# Patient Record
Sex: Male | Born: 1965 | Race: Black or African American | Hispanic: No | Smoking: Never smoker
Health system: Southern US, Community
[De-identification: ages and names within clinical notes are randomized; demographics above are authoritative.]

## PROBLEM LIST (undated history)

## (undated) DIAGNOSIS — M75100 Unspecified rotator cuff tear or rupture of unspecified shoulder, not specified as traumatic: Secondary | ICD-10-CM

## (undated) DIAGNOSIS — R519 Headache, unspecified: Secondary | ICD-10-CM

## (undated) DIAGNOSIS — K219 Gastro-esophageal reflux disease without esophagitis: Secondary | ICD-10-CM

## (undated) DIAGNOSIS — I10 Essential (primary) hypertension: Secondary | ICD-10-CM

## (undated) DIAGNOSIS — M1990 Unspecified osteoarthritis, unspecified site: Secondary | ICD-10-CM

## (undated) DIAGNOSIS — E119 Type 2 diabetes mellitus without complications: Secondary | ICD-10-CM

## (undated) DIAGNOSIS — F4310 Post-traumatic stress disorder, unspecified: Secondary | ICD-10-CM

## (undated) DIAGNOSIS — Z8489 Family history of other specified conditions: Secondary | ICD-10-CM

## (undated) DIAGNOSIS — R51 Headache: Secondary | ICD-10-CM

## (undated) DIAGNOSIS — G4730 Sleep apnea, unspecified: Secondary | ICD-10-CM

## (undated) DIAGNOSIS — N189 Chronic kidney disease, unspecified: Secondary | ICD-10-CM

## (undated) DIAGNOSIS — G43909 Migraine, unspecified, not intractable, without status migrainosus: Secondary | ICD-10-CM

## (undated) HISTORY — PX: KNEE ARTHROSCOPY: SUR90

## (undated) HISTORY — PX: SHOULDER ARTHROSCOPY: SHX128

## (undated) HISTORY — PX: WISDOM TOOTH EXTRACTION: SHX21

---

## 2009-07-13 ENCOUNTER — Encounter: Admission: RE | Admit: 2009-07-13 | Discharge: 2009-07-13 | Payer: Self-pay | Admitting: Orthopaedic Surgery

## 2010-07-17 IMAGING — RF DG FLUORO GUIDE NDL PLC/BX
2 series · 2 of 2 positions shown · IV contrast (magnevist)
Comparison: none

CLINICAL DATA: Right shoulder pain.  Prior history of rotator cuff
repair.

RIGHT SHOULDER INJECTION UNDER FLUOROSCOPY
TECHNIQUE: An appropriate skin entrance site was determined. The
site was marked, prepped with Betadine, draped in the usual sterile
fashion, and infiltrated locally with buffered Lidocaine.  22 gauge
spinal needle was advanced to the superomedial margin of the
humeral head under intermittent fluoroscopy.  1 ml of Lidocaine
injected easily.  A 0.2 ml Magnevist, 10 ml 1% lidocaine, 5 ml
Mamdouh, 5 ml sterile normal saline was then used to opacify the
right shoulder capsule.
Contrast is seen extravasating into the subacromial and subdeltoid
bursa consistent with full-thickness tearing of the rotator cuff.
No immediate complication.
Fluoroscopy Time: 39 seconds.

[Series 1: (hospital) · 1 of 1 slices shown (1 of 2)]
[im 1/1]
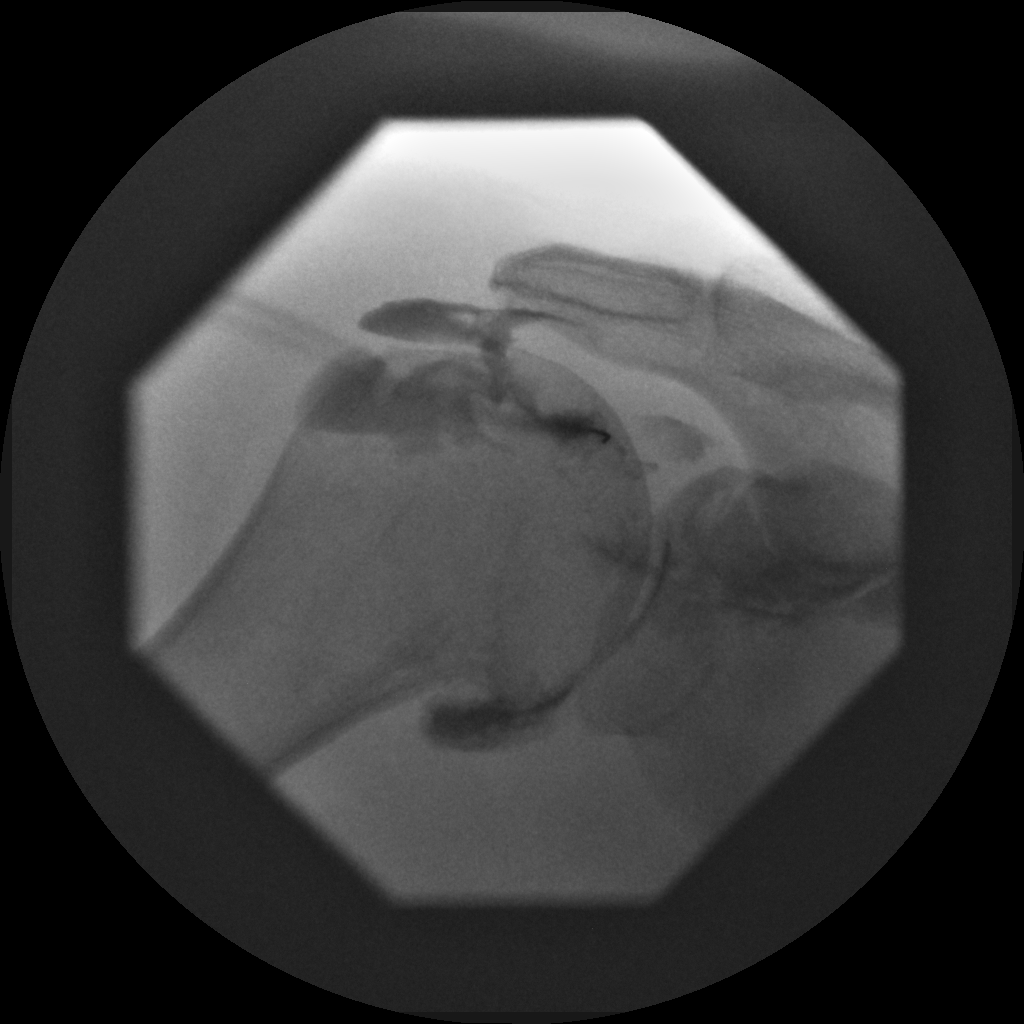

[Series 2: (hospital) · 1 of 1 slices shown (2 of 2)]
[im 1/1]
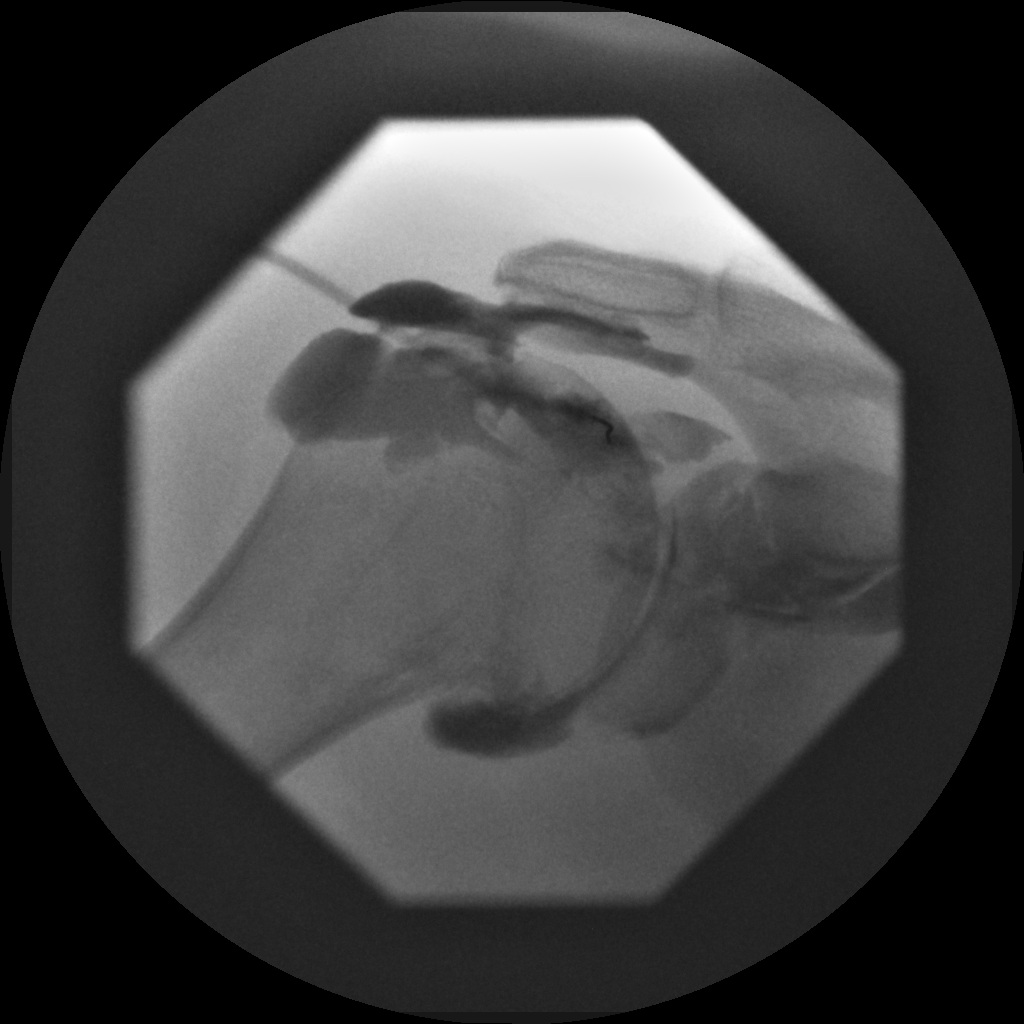

[2 of 2 positions shown; findings below may reference images not displayed]

IMPRESSION: Technically successful right shoulder injection for MRI. Full-
thickness tearing of the rotator cuff is evident.

## 2010-10-21 ENCOUNTER — Encounter: Payer: Self-pay | Admitting: Orthopaedic Surgery

## 2013-09-28 ENCOUNTER — Other Ambulatory Visit: Payer: Self-pay | Admitting: Orthopedic Surgery

## 2013-10-01 ENCOUNTER — Encounter (HOSPITAL_BASED_OUTPATIENT_CLINIC_OR_DEPARTMENT_OTHER): Payer: Self-pay | Admitting: *Deleted

## 2013-10-05 NOTE — H&P (Signed)
Nathan Carson is an 48 y.o. male.   Chief Complaint: Chronic insertional right Achilles tendinitis   HPI: Patient is seen in consultation from Dr. Althea Charon for chronic right Achilles tendinitis with a recent MRI scan that shows a partial thickness tear off the calcaneus.  He was injured at work as a [place] [employer] worker in May of 2014, has been treated extensively with a boot for 2 months, physical therapy with stretching and still has persistent pain that limits his activities and makes him limp.  Presently he is standing and walking 1/8 hours a day and has a 10 pound maximum lift and even with this has persistent pain that limits his activities on the weekends.  He brings the MRI scan of his ankle with him and it does show edema at the calcaneal tubercle with a partial thickness tear of the Achilles tendon.  This is also where he is exquisitely tender.  Past Medical History  Diagnosis Date  . Sleep apnea     wears CPAP sometimes  . Hyperlipidemia     takes simvastatin    Past Surgical History  Procedure Laterality Date  . Shoulder arthroscopy Right   . Knee arthroscopy Left     History reviewed. No pertinent family history. Social History:  reports that he has never smoked. He has never used smokeless tobacco. He reports that he drinks alcohol. He reports that he does not use illicit drugs.  Allergies:  Allergies  Allergen Reactions  . Sulfa Antibiotics Hives    No prescriptions prior to admission    No results found for this or any previous visit (from the past 48 hour(s)). No results found.  Review of Systems  Constitutional: Negative.   HENT: Negative.   Eyes: Negative.   Respiratory: Negative.   Cardiovascular: Negative.   Gastrointestinal: Negative.   Genitourinary: Negative.   Musculoskeletal: Positive for joint pain.  Skin: Negative.   Neurological: Negative.   Endo/Heme/Allergies: Negative.   Psychiatric/Behavioral: Negative.     Height 6' (1.829 m), weight  127.461 kg (281 lb). Physical Exam  Constitutional: He is oriented to person, place, and time. He appears well-developed and well-nourished.  HENT:  Head: Normocephalic and atraumatic.  Eyes: Pupils are equal, round, and reactive to light.  Neck: Normal range of motion. Neck supple.  Cardiovascular: Intact distal pulses.   Respiratory: Effort normal.  Musculoskeletal:  Regarding his flexibility with his knee in full extension he can dorsiflex his right foot 30 he is tender at the insertion of the Achilles tendon and the swelling is about 50% thicker than the contralateral side.  When he tries to do a standing toe raise on that side it causes pain and he cannot do it.  He is neurovascularly intact.  There are no cuts scrapes or abrasions to the skin.  Neurological: He is alert and oriented to person, place, and time.  Skin: Skin is warm and dry.  Psychiatric: He has a normal mood and affect. His behavior is normal. Judgment and thought content normal.     Assessment/Plan  Assess: Chronic insertional right Achilles tendinitis with partial-thickness tear, now symptomatic approaching 7 months.  Plan: Options were discussed with the patient.  He is a candidate for removal of the scar tissue and then reinsertion of the Achilles tendon on the calcaneal tubercle.  This will require 3-6 months of rehabilitation before he be able to resume his normal job as a [place] [employer] worker.  He is failed to heal with conservative measures  and this is a reasonable option at this point.  I filled out a new CA 17, and continued his work restrictions with a 10 pound continuous lift 15 pound intermittent left walking 1 hour a day with a maximum standing of 8 hours a day.  PHILLIPS, ERIC R 10/05/2013, 11:12 AM

## 2013-10-06 ENCOUNTER — Ambulatory Visit (HOSPITAL_BASED_OUTPATIENT_CLINIC_OR_DEPARTMENT_OTHER): Admitting: Anesthesiology

## 2013-10-06 ENCOUNTER — Encounter (HOSPITAL_BASED_OUTPATIENT_CLINIC_OR_DEPARTMENT_OTHER)
Admission: RE | Disposition: A | Discharge status: Home / Self Care | Payer: Self-pay | Source: Ambulatory Visit | Attending: Orthopedic Surgery

## 2013-10-06 ENCOUNTER — Ambulatory Visit (HOSPITAL_BASED_OUTPATIENT_CLINIC_OR_DEPARTMENT_OTHER)
Admission: RE | Admit: 2013-10-06 | Discharge: 2013-10-06 | Disposition: A | Discharge status: Home / Self Care | Source: Ambulatory Visit | Attending: Orthopedic Surgery | Admitting: Orthopedic Surgery

## 2013-10-06 ENCOUNTER — Encounter (HOSPITAL_BASED_OUTPATIENT_CLINIC_OR_DEPARTMENT_OTHER): Payer: Self-pay | Admitting: *Deleted

## 2013-10-06 ENCOUNTER — Encounter (HOSPITAL_BASED_OUTPATIENT_CLINIC_OR_DEPARTMENT_OTHER): Admitting: Anesthesiology

## 2013-10-06 DIAGNOSIS — G4730 Sleep apnea, unspecified: Secondary | ICD-10-CM | POA: Diagnosis not present

## 2013-10-06 DIAGNOSIS — M7660 Achilles tendinitis, unspecified leg: Secondary | ICD-10-CM | POA: Insufficient documentation

## 2013-10-06 DIAGNOSIS — Y990 Civilian activity done for income or pay: Secondary | ICD-10-CM | POA: Diagnosis not present

## 2013-10-06 DIAGNOSIS — X58XXXA Exposure to other specified factors, initial encounter: Secondary | ICD-10-CM | POA: Diagnosis not present

## 2013-10-06 DIAGNOSIS — Z6838 Body mass index (BMI) 38.0-38.9, adult: Secondary | ICD-10-CM | POA: Diagnosis not present

## 2013-10-06 DIAGNOSIS — S96819A Strain of other specified muscles and tendons at ankle and foot level, unspecified foot, initial encounter: Principal | ICD-10-CM

## 2013-10-06 DIAGNOSIS — S93499A Sprain of other ligament of unspecified ankle, initial encounter: Secondary | ICD-10-CM | POA: Diagnosis not present

## 2013-10-06 DIAGNOSIS — S86011A Strain of right Achilles tendon, initial encounter: Secondary | ICD-10-CM

## 2013-10-06 HISTORY — PX: ACHILLES TENDON SURGERY: SHX542

## 2013-10-06 HISTORY — DX: Sleep apnea, unspecified: G47.30

## 2013-10-06 LAB — POCT HEMOGLOBIN-HEMACUE: Hemoglobin: 15 g/dL (ref 13.0–17.0)

## 2013-10-06 SURGERY — REPAIR, TENDON, ACHILLES
Anesthesia: General | Site: Ankle | Laterality: Right

## 2013-10-06 MED ORDER — DEXTROSE 5 % IV SOLN
3.0000 g | Freq: Once | INTRAVENOUS | Status: AC
  Administered 2013-10-06: 3 g via INTRAVENOUS

## 2013-10-06 MED ORDER — OXYCODONE HCL 5 MG/5ML PO SOLN: 5.0000 mg | Freq: Once | ORAL | Status: DC | PRN

## 2013-10-06 MED ORDER — FENTANYL CITRATE 0.05 MG/ML IJ SOLN
INTRAMUSCULAR | Status: AC
  Filled 2013-10-06: qty 2

## 2013-10-06 MED ORDER — CEFAZOLIN SODIUM-DEXTROSE 2-3 GM-% IV SOLR: 2.0000 g | INTRAVENOUS | Status: DC

## 2013-10-06 MED ORDER — DEXAMETHASONE SODIUM PHOSPHATE 10 MG/ML IJ SOLN
INTRAMUSCULAR | Status: DC | PRN
  Administered 2013-10-06: 10 mg

## 2013-10-06 MED ORDER — BUPIVACAINE-EPINEPHRINE PF 0.5-1:200000 % IJ SOLN
INTRAMUSCULAR | Status: DC | PRN
  Administered 2013-10-06: 30 mL via PERINEURAL

## 2013-10-06 MED ORDER — ONDANSETRON HCL 4 MG/2ML IJ SOLN
INTRAMUSCULAR | Status: DC | PRN
  Administered 2013-10-06: 4 mg via INTRAVENOUS

## 2013-10-06 MED ORDER — MIDAZOLAM HCL 2 MG/2ML IJ SOLN
INTRAMUSCULAR | Status: AC
  Filled 2013-10-06: qty 2

## 2013-10-06 MED ORDER — CHLORHEXIDINE GLUCONATE 4 % EX LIQD: 60.0000 mL | Freq: Once | CUTANEOUS | Status: DC

## 2013-10-06 MED ORDER — PROPOFOL 10 MG/ML IV BOLUS
INTRAVENOUS | Status: DC | PRN
  Administered 2013-10-06: 50 mg via INTRAVENOUS
  Administered 2013-10-06: 270 mg via INTRAVENOUS

## 2013-10-06 MED ORDER — LIDOCAINE HCL (CARDIAC) 10 MG/ML IV SOLN
INTRAVENOUS | Status: DC | PRN
  Administered 2013-10-06: 100 mg via INTRAVENOUS

## 2013-10-06 MED ORDER — DEXAMETHASONE SODIUM PHOSPHATE 4 MG/ML IJ SOLN
INTRAMUSCULAR | Status: DC | PRN
  Administered 2013-10-06: 10 mg via INTRAVENOUS

## 2013-10-06 MED ORDER — SUCCINYLCHOLINE CHLORIDE 20 MG/ML IJ SOLN
INTRAMUSCULAR | Status: DC | PRN
  Administered 2013-10-06: 160 mg via INTRAVENOUS

## 2013-10-06 MED ORDER — FENTANYL CITRATE 0.05 MG/ML IJ SOLN
50.0000 ug | INTRAMUSCULAR | Status: DC | PRN
  Administered 2013-10-06: 100 ug via INTRAVENOUS

## 2013-10-06 MED ORDER — HYDROMORPHONE HCL PF 1 MG/ML IJ SOLN
0.2500 mg | INTRAMUSCULAR | Status: DC | PRN
  Administered 2013-10-06: 0.5 mg via INTRAVENOUS
  Filled 2013-10-06: qty 1

## 2013-10-06 MED ORDER — ONDANSETRON HCL 4 MG/2ML IJ SOLN: 4.0000 mg | Freq: Once | INTRAMUSCULAR | Status: DC | PRN

## 2013-10-06 MED ORDER — HYDROCODONE-ACETAMINOPHEN 5-325 MG PO TABS: 1.0000 | ORAL_TABLET | Freq: Four times a day (QID) | ORAL | Status: DC | PRN

## 2013-10-06 MED ORDER — FENTANYL CITRATE 0.05 MG/ML IJ SOLN
INTRAMUSCULAR | Status: AC
  Filled 2013-10-06: qty 4

## 2013-10-06 MED ORDER — OXYCODONE HCL 5 MG PO TABS: 5.0000 mg | ORAL_TABLET | Freq: Once | ORAL | Status: DC | PRN

## 2013-10-06 MED ORDER — MIDAZOLAM HCL 2 MG/2ML IJ SOLN
1.0000 mg | INTRAMUSCULAR | Status: DC | PRN
  Administered 2013-10-06: 2 mg via INTRAVENOUS

## 2013-10-06 MED ORDER — GLYCOPYRROLATE 0.2 MG/ML IJ SOLN
INTRAMUSCULAR | Status: DC | PRN
  Administered 2013-10-06: 0.2 mg via INTRAVENOUS

## 2013-10-06 MED ORDER — EPHEDRINE SULFATE 50 MG/ML IJ SOLN
INTRAMUSCULAR | Status: DC | PRN
  Administered 2013-10-06 (×3): 10 mg via INTRAVENOUS

## 2013-10-06 MED ORDER — FENTANYL CITRATE 0.05 MG/ML IJ SOLN
INTRAMUSCULAR | Status: DC | PRN
  Administered 2013-10-06: 100 ug via INTRAVENOUS

## 2013-10-06 MED ORDER — LACTATED RINGERS IV SOLN
INTRAVENOUS | Status: DC
  Administered 2013-10-06 (×2): via INTRAVENOUS

## 2013-10-06 MED ORDER — PROPOFOL 10 MG/ML IV BOLUS
INTRAVENOUS | Status: AC
  Filled 2013-10-06: qty 20

## 2013-10-06 MED ORDER — DEXTROSE-NACL 5-0.45 % IV SOLN: INTRAVENOUS | Status: DC

## 2013-10-06 SURGICAL SUPPLY — 70 items
BANDAGE ELASTIC 4 VELCRO ST LF (GAUZE/BANDAGES/DRESSINGS) ×3 IMPLANT
BANDAGE ELASTIC 6 VELCRO ST LF (GAUZE/BANDAGES/DRESSINGS) IMPLANT
BANDAGE ESMARK 6X9 LF (GAUZE/BANDAGES/DRESSINGS) ×1 IMPLANT
BLADE SURG 10 STRL SS (BLADE) ×3 IMPLANT
BLADE SURG 15 STRL LF DISP TIS (BLADE) ×1 IMPLANT
BLADE SURG 15 STRL SS (BLADE) ×2
BNDG ESMARK 6X9 LF (GAUZE/BANDAGES/DRESSINGS) ×3
COVER TABLE BACK 60X90 (DRAPES) ×3 IMPLANT
CUFF TOURNIQUET SINGLE 34IN LL (TOURNIQUET CUFF) IMPLANT
DECANTER SPIKE VIAL GLASS SM (MISCELLANEOUS) IMPLANT
DRAPE EXTREMITY T 121X128X90 (DRAPE) ×3 IMPLANT
DRAPE U 20/CS (DRAPES) ×3 IMPLANT
DRAPE U-SHAPE 47X51 STRL (DRAPES) ×3 IMPLANT
DURA STEPPER LG (CAST SUPPLIES) ×3 IMPLANT
DURAPREP 26ML APPLICATOR (WOUND CARE) ×3 IMPLANT
ELECT REM PT RETURN 9FT ADLT (ELECTROSURGICAL) ×3
ELECTRODE REM PT RTRN 9FT ADLT (ELECTROSURGICAL) ×1 IMPLANT
GAUZE SPONGE 4X4 16PLY XRAY LF (GAUZE/BANDAGES/DRESSINGS) IMPLANT
GAUZE XEROFORM 1X8 LF (GAUZE/BANDAGES/DRESSINGS) ×3 IMPLANT
GLOVE BIO SURGEON STRL SZ7.5 (GLOVE) ×3 IMPLANT
GLOVE BIO SURGEON STRL SZ8.5 (GLOVE) ×3 IMPLANT
GLOVE BIOGEL PI IND STRL 7.0 (GLOVE) ×1 IMPLANT
GLOVE BIOGEL PI IND STRL 8 (GLOVE) ×1 IMPLANT
GLOVE BIOGEL PI IND STRL 9 (GLOVE) ×1 IMPLANT
GLOVE BIOGEL PI INDICATOR 7.0 (GLOVE) ×2
GLOVE BIOGEL PI INDICATOR 8 (GLOVE) ×2
GLOVE BIOGEL PI INDICATOR 9 (GLOVE) ×2
GLOVE SURG SS PI 7.0 STRL IVOR (GLOVE) ×6 IMPLANT
GOWN STRL REUS W/ TWL LRG LVL3 (GOWN DISPOSABLE) ×1 IMPLANT
GOWN STRL REUS W/ TWL XL LVL3 (GOWN DISPOSABLE) ×2 IMPLANT
GOWN STRL REUS W/TWL LRG LVL3 (GOWN DISPOSABLE) ×2
GOWN STRL REUS W/TWL XL LVL3 (GOWN DISPOSABLE) ×4
NEEDLE 1/2 CIR CATGUT .05X1.09 (NEEDLE) ×3 IMPLANT
NEEDLE HYPO 25X1 1.5 SAFETY (NEEDLE) IMPLANT
NS IRRIG 1000ML POUR BTL (IV SOLUTION) ×3 IMPLANT
PAD CAST 4YDX4 CTTN HI CHSV (CAST SUPPLIES) ×1 IMPLANT
PADDING CAST ABS 4INX4YD NS (CAST SUPPLIES) ×2
PADDING CAST ABS COTTON 4X4 ST (CAST SUPPLIES) ×1 IMPLANT
PADDING CAST COTTON 4X4 STRL (CAST SUPPLIES) ×2
PADDING CAST SYNTHETIC 4 (CAST SUPPLIES)
PADDING CAST SYNTHETIC 4X4 STR (CAST SUPPLIES) IMPLANT
PASSER SUT SWANSON 36MM LOOP (INSTRUMENTS) IMPLANT
PENCIL BUTTON HOLSTER BLD 10FT (ELECTRODE) ×3 IMPLANT
SCOTCHCAST PLUS 4X4 WHITE (CAST SUPPLIES) IMPLANT
SLEEVE SCD COMPRESS KNEE MED (MISCELLANEOUS) IMPLANT
SPONGE GAUZE 4X4 12PLY (GAUZE/BANDAGES/DRESSINGS) ×3 IMPLANT
SPONGE LAP 4X18 X RAY DECT (DISPOSABLE) ×3 IMPLANT
STATEK 3.5MMX9.04MM (Orthopedic Implant) ×12 IMPLANT
STOCKINETTE 6  STRL (DRAPES)
STOCKINETTE 6 STRL (DRAPES) IMPLANT
SUCTION FRAZIER TIP 10 FR DISP (SUCTIONS) IMPLANT
SUT ETHIBOND 2 OS 4 DA (SUTURE) ×12 IMPLANT
SUT ETHILON 3 0 PS 1 (SUTURE) ×3 IMPLANT
SUT FIBERWIRE #2 38 T-5 BLUE (SUTURE) ×6
SUT FIBERWIRE #5 38 CONV NDL (SUTURE) ×9
SUT VIC AB 2-0 CT1 27 (SUTURE) ×2
SUT VIC AB 2-0 CT1 TAPERPNT 27 (SUTURE) ×1 IMPLANT
SUT VIC AB 3-0 FS2 27 (SUTURE) IMPLANT
SUT VIC AB 3-0 SH 27 (SUTURE) ×2
SUT VIC AB 3-0 SH 27X BRD (SUTURE) ×1 IMPLANT
SUTURE FIBERWR #2 38 T-5 BLUE (SUTURE) ×2 IMPLANT
SUTURE FIBERWR #5 38 CONV NDL (SUTURE) ×3 IMPLANT
SYR BULB 3OZ (MISCELLANEOUS) ×3 IMPLANT
SYR BULB IRRIGATION 50ML (SYRINGE) ×3 IMPLANT
SYR CONTROL 10ML LL (SYRINGE) IMPLANT
TOWEL OR 17X24 6PK STRL BLUE (TOWEL DISPOSABLE) ×3 IMPLANT
TUBE CONNECTING 20'X1/4 (TUBING)
TUBE CONNECTING 20X1/4 (TUBING) IMPLANT
UNDERPAD 30X30 INCONTINENT (UNDERPADS AND DIAPERS) ×3 IMPLANT
YANKAUER SUCT BULB TIP NO VENT (SUCTIONS) ×3 IMPLANT

## 2013-10-06 NOTE — Discharge Instructions (Signed)
Achilles Tendon Repair  °Care After °AFTER THE PROCEDURE °· You will be taken to the recovery area where a nurse will watch and check your progress. Once you are awake, stable, and taking fluids well, if there are no other problems, you will be allowed to go home. °· If this was done as a same day surgery, make sure to have a responsible adult with you for the first 24 hours following your surgery. °· Do not drink alcohol, drive a car, use public transportation, or sign important papers for at least one day after surgery. °· Do not resume physical activities until directed by your caregiver and surgeon. °· Apply ice to the operative site for 15-20 minutes, 03-04 times per day for the first 2-3 days, or as directed. Put the ice in a plastic bag and place a towel between the bag of ice and your skin, splint or cast. °· Only take over-the-counter or prescription medicines for pain, discomfort, or fever as directed by your caregiver. °· You may resume your normal diet as directed. °· Change your dressings as directed. °· Use crutches and move about only as instructed. °· Keep leg raised above the level of the heart (the center of the chest) at all times when not using the bathroom, etc. Do not dangle the leg over a chair, couch or bed. When lying down, raise your leg on a couple of pillows. Keeping your leg up this way prevents swelling and reduces pain. °· Avoid use of your leg other than gentle range of motion with your toes. °SEEK MEDICAL CARE IF:  °· There is redness, swelling, or increasing pain in the wound. °· There is pus coming from the wound. °· There is drainage from a wound lasting longer than one day. °· An unexplained oral temperature above 102° F (38.9° C) develops. °· You notice a bad smell coming from the wound or dressing. °· The wound edges break apart after sutures or staples have been removed. °· You develop continuing nausea or vomiting. °SEEK IMMEDIATE MEDICAL CARE IF:  °· Your pain and swelling  increase or pain is uncontrolled with medicine. °· You develop new, unexplained symptoms. °· You have trouble moving or cannot move your toes or foot, or develop warmth and swelling in your foot, or begin running an unexplained temperature. °· You develop a rash. °· You have a hard time breathing. °· You develop or feel you are developing any reaction or side effects to the medicines given. °Document Released: 05/22/2004 Document Revised: 12/09/2011 Document Reviewed: 07/06/2008 °Exit[employer]® Patient Information ©2014 [employer]. ° °Regional Anesthesia Blocks ° °1. Numbness or the inability to move the "blocked" extremity may last from 3-48 hours after placement. The length of time depends on the medication injected and your individual response to the medication. If the numbness is not going away after 48 hours, call your surgeon. ° °2. The extremity that is blocked will need to be protected until the numbness is gone and the  Strength has returned. Because you cannot feel it, you will need to take extra care to avoid injury. Because it may be weak, you may have difficulty moving it or using it. You may not know what position it is in without looking at it while the block is in effect. ° °3. For blocks in the legs and feet, returning to weight bearing and walking needs to be done carefully. You will need to wait until the numbness is entirely gone and the strength has returned.   You should be able to move your leg and foot normally before you try and bear weight or walk. You will need someone to be with you when you first try to ensure you do not fall and possibly risk injury.  4. Bruising and tenderness at the needle site are common side effects and will resolve in a few days.  5. Persistent numbness or new problems with movement should be communicated to the surgeon or the [facility] [phone])/ [facility] [phone]).   Post Anesthesia Home Care  Instructions  Activity: Get plenty of rest for the remainder of the day. A responsible adult should stay with you for 24 hours following the procedure.  For the next 24 hours, DO NOT: -Drive a car -Advertising copywriterperate machinery -Drink alcoholic beverages -Take any medication unless instructed by your physician -Make any legal decisions or sign important papers.  Meals: Start with liquid foods such as gelatin or soup. Progress to regular foods as tolerated. Avoid greasy, spicy, heavy foods. If nausea and/or vomiting occur, drink only clear liquids until the nausea and/or vomiting subsides. Call your physician if vomiting continues.  Special Instructions/Symptoms: Your throat may feel dry or sore from the anesthesia or the breathing tube placed in your throat during surgery. If this causes discomfort, gargle with warm salt water. The discomfort should disappear within 24 hours.

## 2013-10-06 NOTE — Anesthesia Preprocedure Evaluation (Signed)
Anesthesia Evaluation  Patient identified by MRN, date of birth, ID band Patient awake    Reviewed: Allergy & Precautions, H&P , NPO status , Patient's Chart, lab work & pertinent test results  Airway Mallampati: I TM Distance: >3 FB Neck ROM: Full    Dental  (+) Teeth Intact and Dental Advisory Given   Pulmonary sleep apnea and Continuous Positive Airway Pressure Ventilation ,  breath sounds clear to auscultation        Cardiovascular Rhythm:Regular Rate:Normal     Neuro/Psych    GI/Hepatic   Endo/Other  Morbid obesity  Renal/GU      Musculoskeletal   Abdominal   Peds  Hematology   Anesthesia Other Findings   Reproductive/Obstetrics                           Anesthesia Physical Anesthesia Plan  ASA: III  Anesthesia Plan: General   Post-op Pain Management:    Induction: Intravenous  Airway Management Planned: LMA  Additional Equipment:   Intra-op Plan:   Post-operative Plan: Extubation in OR  Informed Consent: I have reviewed the patients History and Physical, chart, labs and discussed the procedure including the risks, benefits and alternatives for the proposed anesthesia with the patient or authorized representative who has indicated his/her understanding and acceptance.   Dental advisory given  Plan Discussed with: CRNA, Anesthesiologist and Surgeon  Anesthesia Plan Comments:         Anesthesia Quick Evaluation

## 2013-10-06 NOTE — Transfer of Care (Signed)
Immediate Anesthesia Transfer of Care Note  Patient: Nathan FritzBrian Carson  Procedure(s) Performed: Procedure(s): RIGHT ACHILLES TENDON REPAIR (Right)  Patient Location: PACU  Anesthesia Type:GA combined with regional for post-op pain  Level of Consciousness: awake, sedated, patient cooperative and confused  Airway & Oxygen Therapy: Patient Spontanous Breathing and Patient connected to face mask oxygen  Post-op Assessment: Report given to PACU RN and Post -op Vital signs reviewed and stable  Post vital signs: Reviewed and stable  Complications: No apparent anesthesia complications

## 2013-10-06 NOTE — Interval H&P Note (Signed)
History and Physical Interval Note:  10/06/2013 11:40 AM  Nathan FritzBrian Crunk  has presented today for surgery, with the diagnosis of CHRONIC RIGHT ACHILLES PARTIAL INSERTION TEAR  The various methods of treatment have been discussed with the patient and family. After consideration of risks, benefits and other options for treatment, the patient has consented to  Procedure(s): RIGHT ACHILLES TENDON REPAIR (Right) as a surgical intervention .  The patient's history has been reviewed, patient examined, no change in status, stable for surgery.  I have reviewed the patient's chart and labs.  Questions were answered to the patient's satisfaction.     Nestor LewandowskyOWAN, J

## 2013-10-06 NOTE — Anesthesia Postprocedure Evaluation (Signed)
Anesthesia Post Note  Patient: Nathan Carson  Procedure(s) Performed: Procedure(s) (LRB): RIGHT ACHILLES TENDON REPAIR (Right)  Anesthesia type: General  Patient location: PACU  Post pain: Pain level controlled  Post assessment: Patient's Cardiovascular Status Stable  Last Vitals:  Filed Vitals:   10/06/13 1345  BP: 133/82  Pulse: 91  Temp:   Resp: 18    Post vital signs: Reviewed and stable  Level of consciousness: alert  Complications: No apparent anesthesia complications

## 2013-10-06 NOTE — Op Note (Signed)
Pre Op Dx:R Achilles Partial Tear  Post Op Dx: Same  Procedure: R achilles tendon insertional repair with 4x 3.5 mm staytak anchors.  Surgeon: Nestor LewandowskyFrank J , MD  Assistant: Tomi LikensEric K. Gaylene BrooksPhillips PA-C  (present throughout entire procedure and necessary for timely completion of the procedure)  Anesthesia: General  EBL:min  Fluids: 800  Tourniquet Time: 48min  Indications:Injured at work with postal service, chronic R Achilles pain and swelling despite, PT, job modification, rest. MRI shows cronic 30% insertional tear on prox calcaneal tubercle. Desires elective repair. Risks and benefits of surgery discussed.  Procedure: Patient identified by armband, received pre-op IV ABX, [facility] Appropriate anes monitors attached and GET induced on gurney, rolled prone on OR table with chest rolls, well padded. Tourniquet applied to R leg, prepped and draped in usual sterile fashion, time out performed. Posterior longitudinal incision just to the lateral midline centered over the proximal aspect of the calcaneal tubercle 6 cm in length was made through the skin subcutaneous tissue and the sheath over the patellar tendon. Distally the tendon was in continuity as inserted distally on the calcaneal tubercle the proximal two thirds of the tendon insertion our delaminated off the tubercle, there was a fluid interval and the bone had a cortical margin at this point we went ahead and remove the cortical shell off the proximal aspect of the calcaneal tubercle and clean scar tissue off the proximal two thirds of the tendon insertion. We then placed 4 3.5 mm Staytak anchors into the cancellus bone with good fixation and tied each suture pair fixing the proximal two thirds of the patellar tendon insertion into the cancellus bed. We then performed a cross mattress type or pair with the limbs of the suture going diagonally and vertically. At this point a tourniquet was let down the wound was irrigated small  bleeders cauterized we then closed in layers with 3-0 Vicryl suture and the subcutaneous tissue and subcuticular. A dressing of Xerofoam 4 x 4 dressing sponges web roll Ace wrap and a Cam Walker boot was then applied. The tourniquet was let down, the patient was then rolled supine awakened extubated and taken to the recovery without difficulty.

## 2013-10-06 NOTE — Progress Notes (Signed)
Assisted Dr. Crews with right, ultrasound guided, popliteal block. Side rails up, monitors on throughout procedure. See vital signs in flow sheet. Tolerated Procedure well. 

## 2013-10-06 NOTE — Anesthesia Procedure Notes (Addendum)
Anesthesia Regional Block:  Popliteal block  Pre-Anesthetic Checklist: ,, timeout performed, Correct Patient, Correct Site, Correct Laterality, Correct Procedure, Correct Position, site marked, Risks and benefits discussed,  Surgical consent,  Pre-op evaluation,  At surgeon's request and post-op pain management  Laterality: Right and Lower  Prep: chloraprep       Needles:  Injection technique: Single-shot  Needle Type: Echogenic Needle     Needle Length: 9cm  Needle Gauge: 21 and 21 G    Additional Needles:  Procedures: ultrasound guided (picture in chart) Popliteal block Narrative:  Start time: 10/06/2013 11:00 AM End time: 10/06/2013 11:06 AM Injection made incrementally with aspirations every 5 mL.  Performed by: Personally  Anesthesiologist: Sheldon Silvanavid Crews, MD   Procedure Name: Intubation Date/Time: 10/06/2013 11:54 AM Performed by: Gar GibbonKEETON,  S Pre-anesthesia Checklist: Patient identified, Emergency Drugs available, Suction available and Patient being monitored Patient Re-evaluated:Patient Re-evaluated prior to inductionOxygen Delivery Method: Circle System Utilized Preoxygenation: Pre-oxygenation with 100% oxygen Intubation Type: IV induction Ventilation: Mask ventilation without difficulty Grade View: Grade IV Tube type: Oral Tube size: 8.0 mm Number of attempts: 1 Airway Equipment and Method: stylet,  oral airway and Video-laryngoscopy Placement Confirmation: ETT inserted through vocal cords under direct vision,  positive ETCO2 and breath sounds checked- equal and bilateral Tube secured with: Tape Dental Injury: Teeth and Oropharynx as per pre-operative assessment

## 2013-10-07 ENCOUNTER — Encounter (HOSPITAL_BASED_OUTPATIENT_CLINIC_OR_DEPARTMENT_OTHER): Payer: Self-pay | Admitting: Orthopedic Surgery

## 2014-11-29 ENCOUNTER — Encounter (HOSPITAL_BASED_OUTPATIENT_CLINIC_OR_DEPARTMENT_OTHER): Payer: Self-pay | Admitting: *Deleted

## 2014-11-29 DIAGNOSIS — M75100 Unspecified rotator cuff tear or rupture of unspecified shoulder, not specified as traumatic: Secondary | ICD-10-CM

## 2014-11-29 HISTORY — DX: Unspecified rotator cuff tear or rupture of unspecified shoulder, not specified as traumatic: M75.100

## 2014-12-05 ENCOUNTER — Ambulatory Visit (HOSPITAL_BASED_OUTPATIENT_CLINIC_OR_DEPARTMENT_OTHER)
Admission: RE | Admit: 2014-12-05 | Discharge: 2014-12-05 | Disposition: A | Discharge status: Home / Self Care | Source: Ambulatory Visit | Attending: Orthopedic Surgery | Admitting: Orthopedic Surgery

## 2014-12-05 ENCOUNTER — Ambulatory Visit (HOSPITAL_BASED_OUTPATIENT_CLINIC_OR_DEPARTMENT_OTHER): Admitting: Anesthesiology

## 2014-12-05 ENCOUNTER — Encounter (HOSPITAL_BASED_OUTPATIENT_CLINIC_OR_DEPARTMENT_OTHER)
Admission: RE | Disposition: A | Discharge status: Home / Self Care | Payer: Self-pay | Source: Ambulatory Visit | Attending: Orthopedic Surgery

## 2014-12-05 ENCOUNTER — Encounter (HOSPITAL_BASED_OUTPATIENT_CLINIC_OR_DEPARTMENT_OTHER): Payer: Self-pay | Admitting: Anesthesiology

## 2014-12-05 DIAGNOSIS — S46011A Strain of muscle(s) and tendon(s) of the rotator cuff of right shoulder, initial encounter: Secondary | ICD-10-CM | POA: Diagnosis not present

## 2014-12-05 DIAGNOSIS — S43431A Superior glenoid labrum lesion of right shoulder, initial encounter: Secondary | ICD-10-CM | POA: Insufficient documentation

## 2014-12-05 DIAGNOSIS — G43909 Migraine, unspecified, not intractable, without status migrainosus: Secondary | ICD-10-CM | POA: Diagnosis not present

## 2014-12-05 DIAGNOSIS — Z91010 Allergy to peanuts: Secondary | ICD-10-CM | POA: Insufficient documentation

## 2014-12-05 DIAGNOSIS — Z882 Allergy status to sulfonamides status: Secondary | ICD-10-CM | POA: Insufficient documentation

## 2014-12-05 HISTORY — DX: Unspecified osteoarthritis, unspecified site: M19.90

## 2014-12-05 HISTORY — DX: Family history of other specified conditions: Z84.89

## 2014-12-05 HISTORY — DX: Migraine, unspecified, not intractable, without status migrainosus: G43.909

## 2014-12-05 HISTORY — PX: SHOULDER ARTHROSCOPY WITH BICEPSTENOTOMY: SHX6204

## 2014-12-05 HISTORY — DX: Unspecified rotator cuff tear or rupture of unspecified shoulder, not specified as traumatic: M75.100

## 2014-12-05 HISTORY — PX: SHOULDER ARTHROSCOPY WITH ROTATOR CUFF REPAIR: SHX5685

## 2014-12-05 HISTORY — DX: Headache: R51

## 2014-12-05 HISTORY — DX: Headache, unspecified: R51.9

## 2014-12-05 LAB — POCT HEMOGLOBIN-HEMACUE: HEMOGLOBIN: 14.3 g/dL (ref 13.0–17.0)

## 2014-12-05 SURGERY — ARTHROSCOPY, SHOULDER, WITH ROTATOR CUFF REPAIR
Anesthesia: General | Site: Shoulder | Laterality: Right

## 2014-12-05 MED ORDER — MIDAZOLAM HCL 2 MG/2ML IJ SOLN
1.0000 mg | INTRAMUSCULAR | Status: DC | PRN
  Administered 2014-12-05: 2 mg via INTRAVENOUS

## 2014-12-05 MED ORDER — LACTATED RINGERS IV SOLN
INTRAVENOUS | Status: DC
  Administered 2014-12-05 (×2): via INTRAVENOUS

## 2014-12-05 MED ORDER — LIDOCAINE HCL (CARDIAC) 20 MG/ML IV SOLN
INTRAVENOUS | Status: DC | PRN
  Administered 2014-12-05: 100 mg via INTRAVENOUS

## 2014-12-05 MED ORDER — PROPOFOL 10 MG/ML IV BOLUS
INTRAVENOUS | Status: DC | PRN
  Administered 2014-12-05: 300 mg via INTRAVENOUS
  Administered 2014-12-05: 30 mg via INTRAVENOUS

## 2014-12-05 MED ORDER — MIDAZOLAM HCL 2 MG/2ML IJ SOLN
INTRAMUSCULAR | Status: AC
  Filled 2014-12-05: qty 2

## 2014-12-05 MED ORDER — SODIUM CHLORIDE 0.9 % IR SOLN
Status: DC | PRN
  Administered 2014-12-05: 6000 mL

## 2014-12-05 MED ORDER — DEXAMETHASONE SODIUM PHOSPHATE 4 MG/ML IJ SOLN
INTRAMUSCULAR | Status: DC | PRN
  Administered 2014-12-05: 10 mg via INTRAVENOUS

## 2014-12-05 MED ORDER — CHLORHEXIDINE GLUCONATE 4 % EX LIQD: 60.0000 mL | Freq: Once | CUTANEOUS | Status: DC

## 2014-12-05 MED ORDER — FENTANYL CITRATE 0.05 MG/ML IJ SOLN
INTRAMUSCULAR | Status: DC | PRN
  Administered 2014-12-05 (×2): 50 ug via INTRAVENOUS

## 2014-12-05 MED ORDER — FENTANYL CITRATE 0.05 MG/ML IJ SOLN
50.0000 ug | INTRAMUSCULAR | Status: DC | PRN
  Administered 2014-12-05: 100 ug via INTRAVENOUS

## 2014-12-05 MED ORDER — MIDAZOLAM HCL 5 MG/5ML IJ SOLN
INTRAMUSCULAR | Status: DC | PRN
  Administered 2014-12-05: 2 mg via INTRAVENOUS

## 2014-12-05 MED ORDER — ONDANSETRON HCL 4 MG/2ML IJ SOLN
INTRAMUSCULAR | Status: DC | PRN
  Administered 2014-12-05: 4 mg via INTRAVENOUS

## 2014-12-05 MED ORDER — FENTANYL CITRATE 0.05 MG/ML IJ SOLN
INTRAMUSCULAR | Status: AC
  Filled 2014-12-05: qty 2

## 2014-12-05 MED ORDER — BUPIVACAINE-EPINEPHRINE (PF) 0.5% -1:200000 IJ SOLN
INTRAMUSCULAR | Status: DC | PRN
  Administered 2014-12-05: 25 mL via PERINEURAL

## 2014-12-05 MED ORDER — MIDAZOLAM HCL 2 MG/ML PO SYRP: 12.0000 mg | ORAL_SOLUTION | Freq: Once | ORAL | Status: DC | PRN

## 2014-12-05 MED ORDER — CEFAZOLIN SODIUM-DEXTROSE 2-3 GM-% IV SOLR
INTRAVENOUS | Status: AC
  Filled 2014-12-05: qty 50

## 2014-12-05 MED ORDER — FENTANYL CITRATE 0.05 MG/ML IJ SOLN
INTRAMUSCULAR | Status: AC
  Filled 2014-12-05: qty 6

## 2014-12-05 MED ORDER — DOCUSATE SODIUM 100 MG PO CAPS: 100.0000 mg | ORAL_CAPSULE | Freq: Three times a day (TID) | ORAL | Status: AC | PRN

## 2014-12-05 MED ORDER — OXYCODONE-ACETAMINOPHEN 5-325 MG PO TABS: 1.0000 | ORAL_TABLET | ORAL | Status: DC | PRN

## 2014-12-05 MED ORDER — CEFAZOLIN SODIUM 10 G IJ SOLR
3.0000 g | INTRAMUSCULAR | Status: AC
  Administered 2014-12-05: 3 g via INTRAVENOUS

## 2014-12-05 MED ORDER — CEFAZOLIN SODIUM 1-5 GM-% IV SOLN
INTRAVENOUS | Status: AC
  Filled 2014-12-05: qty 50

## 2014-12-05 MED ORDER — SUCCINYLCHOLINE CHLORIDE 20 MG/ML IJ SOLN
INTRAMUSCULAR | Status: DC | PRN
  Administered 2014-12-05: 100 mg via INTRAVENOUS

## 2014-12-05 SURGICAL SUPPLY — 79 items
ANCHOR PEEK 4.75X19.1 SWLK C (Anchor) ×6 IMPLANT
BENZOIN TINCTURE PRP APPL 2/3 (GAUZE/BANDAGES/DRESSINGS) IMPLANT
BLADE CLIPPER SURG (BLADE) IMPLANT
BLADE SURG 15 STRL LF DISP TIS (BLADE) IMPLANT
BLADE SURG 15 STRL SS (BLADE)
BUR OVAL 4.0 (BURR) ×3 IMPLANT
CANNULA 5.75X71 LONG (CANNULA) ×3 IMPLANT
CANNULA TWIST IN 8.25X7CM (CANNULA) ×6 IMPLANT
CHLORAPREP W/TINT 26ML (MISCELLANEOUS) ×3 IMPLANT
CLOSURE WOUND 1/2 X4 (GAUZE/BANDAGES/DRESSINGS)
DECANTER SPIKE VIAL GLASS SM (MISCELLANEOUS) IMPLANT
DRAPE INCISE IOBAN 66X45 STRL (DRAPES) ×3 IMPLANT
DRAPE STERI 35X30 U-POUCH (DRAPES) ×3 IMPLANT
DRAPE SURG 17X23 STRL (DRAPES) ×3 IMPLANT
DRAPE U 20/CS (DRAPES) ×3 IMPLANT
DRAPE U-SHAPE 47X51 STRL (DRAPES) ×3 IMPLANT
DRAPE U-SHAPE 76X120 STRL (DRAPES) ×6 IMPLANT
DRSG PAD ABDOMINAL 8X10 ST (GAUZE/BANDAGES/DRESSINGS) ×3 IMPLANT
ELECT REM PT RETURN 9FT ADLT (ELECTROSURGICAL) ×3
ELECTRODE REM PT RTRN 9FT ADLT (ELECTROSURGICAL) ×1 IMPLANT
GAUZE SPONGE 4X4 12PLY STRL (GAUZE/BANDAGES/DRESSINGS) ×3 IMPLANT
GAUZE SPONGE 4X4 16PLY XRAY LF (GAUZE/BANDAGES/DRESSINGS) IMPLANT
GAUZE XEROFORM 1X8 LF (GAUZE/BANDAGES/DRESSINGS) ×3 IMPLANT
GLOVE BIO SURGEON STRL SZ7 (GLOVE) ×3 IMPLANT
GLOVE BIO SURGEON STRL SZ7.5 (GLOVE) ×3 IMPLANT
GLOVE BIOGEL PI IND STRL 7.0 (GLOVE) ×2 IMPLANT
GLOVE BIOGEL PI IND STRL 8 (GLOVE) ×1 IMPLANT
GLOVE BIOGEL PI INDICATOR 7.0 (GLOVE) ×4
GLOVE BIOGEL PI INDICATOR 8 (GLOVE) ×2
GLOVE ECLIPSE 6.5 STRL STRAW (GLOVE) ×3 IMPLANT
GOWN STRL REUS W/ TWL LRG LVL3 (GOWN DISPOSABLE) ×2 IMPLANT
GOWN STRL REUS W/ TWL XL LVL3 (GOWN DISPOSABLE) ×1 IMPLANT
GOWN STRL REUS W/TWL LRG LVL3 (GOWN DISPOSABLE) ×4
GOWN STRL REUS W/TWL XL LVL3 (GOWN DISPOSABLE) ×2
IMMOBILIZER SHOULDER FOAM XLGE (SOFTGOODS) ×3 IMPLANT
LASSO CRESCENT QUICKPASS (SUTURE) IMPLANT
LIQUID BAND (GAUZE/BANDAGES/DRESSINGS) IMPLANT
MANIFOLD NEPTUNE II (INSTRUMENTS) ×3 IMPLANT
NDL SUT 6 .5 CRC .975X.05 MAYO (NEEDLE) IMPLANT
NEEDLE 1/2 CIR CATGUT .05X1.09 (NEEDLE) IMPLANT
NEEDLE MAYO TAPER (NEEDLE)
NEEDLE SCORPION MULTI FIRE (NEEDLE) ×3 IMPLANT
NS IRRIG 1000ML POUR BTL (IV SOLUTION) IMPLANT
PACK ARTHROSCOPY DSU (CUSTOM PROCEDURE TRAY) ×3 IMPLANT
PACK BASIN DAY SURGERY FS (CUSTOM PROCEDURE TRAY) ×3 IMPLANT
PENCIL BUTTON HOLSTER BLD 10FT (ELECTRODE) IMPLANT
RESECTOR FULL RADIUS 4.2MM (BLADE) ×3 IMPLANT
SLEEVE SCD COMPRESS KNEE MED (MISCELLANEOUS) ×3 IMPLANT
SLING ARM IMMOBILIZER MED (SOFTGOODS) IMPLANT
SLING ARM LRG ADULT FOAM STRAP (SOFTGOODS) IMPLANT
SLING ARM MED ADULT FOAM STRAP (SOFTGOODS) IMPLANT
SLING ARM XL FOAM STRAP (SOFTGOODS) IMPLANT
SPONGE LAP 4X18 X RAY DECT (DISPOSABLE) IMPLANT
STRIP CLOSURE SKIN 1/2X4 (GAUZE/BANDAGES/DRESSINGS) IMPLANT
SUCTION FRAZIER TIP 10 FR DISP (SUCTIONS) IMPLANT
SUPPORT WRAP ARM LG (MISCELLANEOUS) ×3 IMPLANT
SUT BONE WAX W31G (SUTURE) IMPLANT
SUT ETHILON 3 0 PS 1 (SUTURE) ×3 IMPLANT
SUT FIBERWIRE #2 38 T-5 BLUE (SUTURE)
SUT MNCRL AB 3-0 PS2 18 (SUTURE) IMPLANT
SUT MNCRL AB 4-0 PS2 18 (SUTURE) IMPLANT
SUT PDS AB 0 CT 36 (SUTURE) IMPLANT
SUT PROLENE 3 0 PS 2 (SUTURE) IMPLANT
SUT TIGER TAPE 7 IN WHITE (SUTURE) ×3 IMPLANT
SUT VIC AB 0 CT1 27 (SUTURE)
SUT VIC AB 0 CT1 27XBRD ANBCTR (SUTURE) IMPLANT
SUT VIC AB 2-0 SH 27 (SUTURE)
SUT VIC AB 2-0 SH 27XBRD (SUTURE) IMPLANT
SUTURE FIBERWR #2 38 T-5 BLUE (SUTURE) IMPLANT
SYR BULB 3OZ (MISCELLANEOUS) IMPLANT
TAPE FIBER 2MM 7IN #2 BLUE (SUTURE) ×3 IMPLANT
TOWEL OR 17X24 6PK STRL BLUE (TOWEL DISPOSABLE) ×3 IMPLANT
TOWEL OR NON WOVEN STRL DISP B (DISPOSABLE) ×3 IMPLANT
TUBE CONNECTING 20'X1/4 (TUBING) ×1
TUBE CONNECTING 20X1/4 (TUBING) ×2 IMPLANT
TUBING ARTHROSCOPY IRRIG 16FT (MISCELLANEOUS) ×3 IMPLANT
WAND STAR VAC 90 (SURGICAL WAND) ×3 IMPLANT
WATER STERILE IRR 1000ML POUR (IV SOLUTION) ×3 IMPLANT
YANKAUER SUCT BULB TIP NO VENT (SUCTIONS) IMPLANT

## 2014-12-05 NOTE — H&P (Signed)
Nathan FritzBrian Nole is an 49 y.o. male.   Chief Complaint: R shoulder pain and weakness HPI: s/p RC repair with subsequent injury at work with retear 6/14.  Past Medical History  Diagnosis Date  . Migraines   . Sinus headache   . Arthritis     knees, right shoulder  . 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 Family history of adverse reaction to anesthesia     pt's mother has hx. of post-op N/V    Past Surgical History  Procedure Laterality Date  . Shoulder arthroscopy Right   . Knee arthroscopy Left   . Achilles tendon surgery Right 10/06/2013    Procedure: RIGHT ACHILLES TENDON REPAIR;  Surgeon: Nestor LewandowskyFrank J Rowan, MD;  Location: [facility];  Service: Orthopedics;  Laterality: Right;    Family History  Problem Relation Age of Onset  . Anesthesia problems Mother     post-op N/V   Social History:  reports that he has never smoked. He has never used smokeless tobacco. He reports that he drinks alcohol. He reports that he does not use illicit drugs.  Allergies:  Allergies  Allergen Reactions  . Peanut-Containing Drug Products Swelling    SWELLING FACE AND TONGUE  . Sulfa Antibiotics Hives    Medications Prior to Admission  Medication Sig Dispense Refill  . cetirizine (ZYRTEC) 10 MG tablet Take 10 mg by mouth 2 (two) times daily.    . naproxen sodium (ANAPROX) 220 MG tablet Take 220 mg by mouth 2 (two) times daily with a meal.    . vitamin B-12 (CYANOCOBALAMIN) 100 MCG tablet Take 100 mcg by mouth daily.      No results found for this or any previous visit (from the past 48 hour(s)). No results found.  Review of Systems  All other systems reviewed and are negative.   Height 6' (1.829 m), weight 126.554 kg (279 lb). Physical Exam  Constitutional: He is oriented to person, place, and time. He appears well-developed and well-nourished.  HENT:  Head: Atraumatic.  Eyes: EOM are normal.  Cardiovascular: Intact distal pulses.   Respiratory: Effort  normal.  Musculoskeletal:  R shoulder pain and weakness with RC testing  Neurological: He is alert and oriented to person, place, and time.  Skin: Skin is warm and dry.  Psychiatric: He has a normal mood and affect.     Assessment/Plan R recurrent RCT Plan attempt at revision RCR, vs debridement. Patient understands given chronicity it may not be repairable. Risks / benefits of surgery discussed Consent on chart  NPO for OR Preop antibiotics   , WILLIAM 12/05/2014, 11:45 AM

## 2014-12-05 NOTE — Anesthesia Postprocedure Evaluation (Signed)
  Anesthesia Post-op Note  Patient: Elane FritzBrian Freiberger  Procedure(s) Performed: Procedure(s) with comments: RIGHT SHOULDER ARTHROSCOPY REVISION WITH ROTATOR CUFF REPAIR  (Right) - Right shoulder arthroscopy revision arthrscopy rotator cuff repair  SHOULDER ARTHROSCOPY WITH BICEPSTENOTOMY (Right)  Patient Location: PACU  Anesthesia Type: General with regional block for post op pain   Level of Consciousness: awake, alert  and oriented  Airway and Oxygen Therapy: Patient Spontanous Breathing  Post-op Pain: none  Post-op Assessment: Post-op Vital signs reviewed  Post-op Vital Signs: Reviewed  Last Vitals:  Filed Vitals:   12/05/14 1500  BP: 131/90  Pulse: 72  Temp:   Resp: 20    Complications: No apparent anesthesia complications
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
## 2014-12-05 NOTE — Anesthesia Preprocedure Evaluation (Signed)
Anesthesia Evaluation  Patient identified by MRN, date of birth, ID band Patient awake    Reviewed: Allergy & Precautions, NPO status , Patient's Chart, lab work & pertinent test results  Airway Mallampati: I  TM Distance: >3 FB Neck ROM: Full    Dental  (+) Teeth Intact, Dental Advisory Given   Pulmonary  breath sounds clear to auscultation        Cardiovascular Rhythm:Regular Rate:Normal     Neuro/Psych    GI/Hepatic   Endo/Other  Morbid obesity  Renal/GU      Musculoskeletal   Abdominal   Peds  Hematology   Anesthesia Other Findings   Reproductive/Obstetrics                            Anesthesia Physical Anesthesia Plan  ASA: II  Anesthesia Plan: General   Post-op Pain Management: MAC Combined w/ Regional for Post-op pain   Induction: Intravenous  Airway Management Planned: Oral ETT  Additional Equipment:   Intra-op Plan:   Post-operative Plan: Extubation in OR  Informed Consent: I have reviewed the patients History and Physical, chart, labs and discussed the procedure including the risks, benefits and alternatives for the proposed anesthesia with the patient or authorized representative who has indicated his/her understanding and acceptance.   Dental advisory given  Plan Discussed with: CRNA, Anesthesiologist and Surgeon  Anesthesia Plan Comments:         Anesthesia Quick Evaluation  

## 2014-12-05 NOTE — Transfer of Care (Signed)
Immediate Anesthesia Transfer of Care Note  Patient: Nathan FritzBrian Carson  Procedure(s) Performed: Procedure(s) with comments: RIGHT SHOULDER ARTHROSCOPY REVISION WITH ROTATOR CUFF REPAIR  (Right) - Right shoulder arthroscopy revision arthrscopy rotator cuff repair  SHOULDER ARTHROSCOPY WITH BICEPSTENOTOMY (Right)  Patient Location: PACU  Anesthesia Type:GA combined with regional for post-op pain  Level of Consciousness: awake and patient cooperative  Airway & Oxygen Therapy: Patient Spontanous Breathing and Patient connected to face mask oxygen  Post-op Assessment: Report given to RN and Post -op Vital signs reviewed and stable  Post vital signs: Reviewed and stable  Last Vitals:  Filed Vitals:   12/05/14 1225  BP:   Pulse: 71  Temp:   Resp: 17    Complications: No apparent anesthesia complications

## 2014-12-05 NOTE — Discharge Instructions (Signed)
Discharge Instructions after Arthroscopic Shoulder Repair ° ° °A sling has been provided for you. Remain in your sling at all times. This includes sleeping in your sling.  °Use ice on the shoulder intermittently over the first 48 hours after surgery.  °Pain medicine has been prescribed for you.  °Use your medicine liberally over the first 48 hours, and then you can begin to taper your use. You may take Extra Strength Tylenol or Tylenol only in place of the pain pills. DO NOT take ANY nonsteroidal anti-inflammatory pain medications: Advil, Motrin, Ibuprofen, Aleve, Naproxen, or Narprosyn.  °You may remove your dressing after two days. If the incision sites are still moist, place a Band-Aid over the moist site(s). Change Band-Aids daily until dry.  °You may shower 5 days after surgery. The incisions CANNOT get wet prior to 5 days. Simply allow the water to wash over the site and then pat dry. Do not rub the incisions. Make sure your axilla (armpit) is completely dry after showering.  °Take one aspirin a day for 2 weeks after surgery, unless you have an aspirin sensitivity/ allergy or asthma. ° ° °Please call [phone] during normal business hours or [phone] after hours for any problems. Including the following: ° °- excessive redness of the incisions °- drainage for more than 4 days °- fever of more than 101.5 F ° °*Please note that pain medications will not be refilled after hours or on weekends. ° °Regional Anesthesia Blocks ° °1. Numbness or the inability to move the "blocked" extremity may last from 3-48 hours after placement. The length of time depends on the medication injected and your individual response to the medication. If the numbness is not going away after 48 hours, call your surgeon. ° °2. The extremity that is blocked will need to be protected until the numbness is gone and the  Strength has returned. Because you cannot feel it, you will need to take extra care to avoid injury. Because it may  be weak, you may have difficulty moving it or using it. You may not know what position it is in without looking at it while the block is in effect. ° °3. For blocks in the legs and feet, returning to weight bearing and walking needs to be done carefully. You will need to wait until the numbness is entirely gone and the strength has returned. You should be able to move your leg and foot normally before you try and bear weight or walk. You will need someone to be with you when you first try to ensure you do not fall and possibly risk injury. ° °4. Bruising and tenderness at the needle site are common side effects and will resolve in a few days. ° °5. Persistent numbness or new problems with movement should be communicated to the surgeon or the [facility] ([phone])/ [facility] ([phone]). ° °Post Anesthesia Home Care Instructions ° °Activity: °Get plenty of rest for the remainder of the day. A responsible adult should stay with you for 24 hours following the procedure.  °For the next 24 hours, DO NOT: °-Drive a car °-Operate machinery °-Drink alcoholic beverages °-Take any medication unless instructed by your physician °-Make any legal decisions or sign important papers. ° °Meals: °Start with liquid foods such as gelatin or soup. Progress to regular foods as tolerated. Avoid greasy, spicy, heavy foods. If nausea and/or vomiting occur, drink only clear liquids until the nausea and/or vomiting subsides. Call your physician if vomiting continues. ° °  Special Instructions/Symptoms: °Your throat may feel dry or sore from the anesthesia or the breathing tube placed in your throat during surgery. If this causes discomfort, gargle with warm salt water. The discomfort should disappear within 24 hours. ° ° °

## 2014-12-05 NOTE — Anesthesia Procedure Notes (Addendum)
Anesthesia Regional Block:  Interscalene brachial plexus block  Pre-Anesthetic Checklist: ,, timeout performed, Correct Patient, Correct Site, Correct Laterality, Correct Procedure, Correct Position, site marked, Risks and benefits discussed,  Surgical consent,  Pre-op evaluation,  At surgeon's request and post-op pain management  Laterality: Right and Upper  Prep: chloraprep       Needles:  Injection technique: Single-shot  Needle Type: Echogenic Needle     Needle Length: 5cm 5 cm Needle Gauge: 21 and 21 G    Additional Needles:  Procedures: ultrasound guided (picture in chart) Interscalene brachial plexus block Narrative:  Start time: 12/05/2014 12:15 PM End time: 12/05/2014 12:20 PM Injection made incrementally with aspirations every 5 mL.  Performed by: Personally  Anesthesiologist: CREWS, DAVID   Procedure Name: Intubation Date/Time: 12/05/2014 12:56 PM Performed by: [place] DesanctisLINKA,  L Pre-anesthesia Checklist: Patient identified, Emergency Drugs available, Suction available, Patient being monitored and Timeout performed Patient Re-evaluated:Patient Re-evaluated prior to inductionOxygen Delivery Method: Circle System Utilized Preoxygenation: Pre-oxygenation with 100% oxygen Intubation Type: IV induction Ventilation: Mask ventilation without difficulty Laryngoscope Size: Miller and 3 Grade View: Grade IV Tube type: Oral Number of attempts: 1 Airway Equipment and Method: Stylet and Oral airway Placement Confirmation: ETT inserted through vocal cords under direct vision,  positive ETCO2 and breath sounds checked- equal and bilateral Secured at: 24 cm Tube secured with: Tape Dental Injury: Teeth and Oropharynx as per pre-operative assessment

## 2014-12-05 NOTE — Progress Notes (Signed)
Assisted Dr. Crews with right, ultrasound guided, interscalene  block. Side rails up, monitors on throughout procedure. See vital signs in flow sheet. Tolerated Procedure well. 

## 2014-12-06 ENCOUNTER — Encounter (HOSPITAL_BASED_OUTPATIENT_CLINIC_OR_DEPARTMENT_OTHER): Payer: Self-pay | Admitting: Orthopedic Surgery

## 2023-03-31 ENCOUNTER — Other Ambulatory Visit: Payer: Self-pay | Admitting: Orthopedic Surgery

## 2023-04-14 ENCOUNTER — Ambulatory Visit: Admit: 2023-04-14 | Payer: Self-pay | Admitting: Orthopedic Surgery

## 2023-04-14 SURGERY — ARTHROPLASTY, KNEE, TOTAL
Anesthesia: Spinal | Site: Knee | Laterality: Right

## 2023-05-01 ENCOUNTER — Encounter (HOSPITAL_COMMUNITY): Payer: Self-pay

## 2023-05-01 NOTE — Progress Notes (Addendum)
COVID Vaccine received:  []  No [x]  Yes Date of any COVID positive Test in last 90 days:  Yes  COVID + on 04-21-2023. No antiviral Rx'd per [facility] note 04-22-23 from [place].  Patient states that he took another test and was not Positive, He has no symptoms today at this PST visit.   PCP -  sees [facility]-   Dr. Charise Carwin Cardiologist - none  Chest x-ray - 06-29-2020  2v  CEW   MD ordered repeat EKG -  MD ordered for PST  Stress Test -  ECHO -  Cardiac Cath -   PCR screen: [x]  Ordered & Completed           []   No Order but Needs PROFEND           []   N/A for this surgery  Surgery Plan:  [x]  Ambulatory                            []  Outpatient in bed                            []  Admit  Anesthesia:    []  General  [x]  Spinal                           []   Choice []   MAC  Pacemaker / ICD device [x]  No []  Yes   Spinal Cord Stimulator:[x]  No []  Yes       History of Sleep Apnea? []  No [x]  Yes   CPAP used?- []  No [x]  Yes    Does the patient monitor blood sugar?          []  No [x]  Yes  []  N/A  Patient has: []  NO Hx DM   []  Pre-DM                 []  DM1  [x]   DM2  Does patient have a [employer] or Dexacom? []  No [x]  Yes   Fasting Blood Sugar Ranges- 93-110 Checks Blood Sugar _continuous  times a day  GLP1 agonist / usual dose -  SEMAGLUTIDE (Ozempic) injecton on Saturdays:  GLP1 instructions: Last injection will be on Saturday 05-03-2023 SGLT-2 inhibitors / usual dose - empagliflozin (Jardiance) 12.5 daily SGLT-2 instructions: Hold x 72 hours before surgery.  Last dose: Thursday 05-08-2023  Blood Thinner / Instructions:  none Aspirin Instructions:  none  ERAS Protocol Ordered: []  No  [x]  Yes PRE-SURGERY []  ENSURE  [x]  G2  Patient is to be NPO after: 0900  Comments: Patient was given the 5 CHG shower / bath instructions for TKA surgery along with 2 bottles of the CHG soap. Patient will start this on:  Thursday 05-08-2023.   All questions were asked and  answered, Patient voiced understanding of this process.   Activity level: Patient is able climb a flight of stairs without difficulty; [x]  No CP  [x]  No SOB, but would have knee pain.  Patient can perform ADLs without assistance.   Anesthesia review: DM2, OSA- CPAP, HTN, Migraines, GERD,   COVID + on 04-21-2023 but patient took another test and it was negative. No antiviral Rx'd per [facility] note 04-22-23 from [place].  Wandra Feinstein at Dr. Wadie Lessen office to make her aware.    Patient denies shortness of breath, fever, cough and chest pain at PAT appointment.  Patient verbalized understanding and agreement to the Pre-Surgical Instructions that were given to them at this PAT appointment. Patient was also educated of the need to review these PAT instructions again prior to his surgery.I reviewed the appropriate phone numbers to call if they have any and questions or concerns.

## 2023-05-01 NOTE — Patient Instructions (Addendum)
SURGICAL WAITING ROOM VISITATION Patients having surgery or a procedure may have no more than 2 support people in the waiting area - these visitors may rotate in the visitor waiting room.   Due to an increase in RSV and influenza rates and associated hospitalizations, children ages 42 and under may not visit patients in [facility] hospitals. If the patient needs to stay at the hospital during part of their recovery, the visitor guidelines for inpatient rooms apply.  PRE-OP VISITATION  Pre-op nurse will coordinate an appropriate time for 1 support person to accompany the patient in pre-op.  This support person may not rotate.  This visitor will be contacted when the time is appropriate for the visitor to come back in the pre-op area.  Please refer to the [facility] website for the visitor guidelines for Inpatients (after your surgery is over and you are in a regular room).  You are not required to quarantine at this time prior to your surgery. However, you must do this: Hand Hygiene often Do NOT share personal items Notify your provider if you are in close contact with someone who has COVID or you develop fever 100.4 or greater, new onset of sneezing, cough, sore throat, shortness of breath or body aches.  If you test positive for Covid or have been in contact with anyone that has tested positive in the last 10 days please notify you surgeon.    Your procedure is scheduled on:  Monday  May 12, 2023  Report to [facility] Main Entrance: [place] entrance where the [employer] is available.   Report to admitting at:   0930  AM  Call this number if you have any questions or problems the morning of surgery [phone]  Do not eat food after Midnight the night prior to your surgery/procedure.  After Midnight you may have the following liquids until  09:00   AM DAY OF SURGERY  Clear Liquid Diet Water Black Coffee (sugar ok, NO MILK/CREAM OR CREAMERS)  Tea (sugar ok, NO  MILK/CREAM OR CREAMERS) regular and decaf                             Plain Jell-O  with no fruit (NO RED)                                           Fruit ices (not with fruit pulp, NO RED)                                     Popsicles (NO RED)                                                                  Juice: NO CITRUS JUICES: only apple, WHITE grape, WHITE cranberry Sports drinks like Gatorade or Powerade (NO RED)                   The day of surgery:  Drink ONE (1) Pre-Surgery G2 at   09:00  AM the morning of surgery. Drink in one sitting. Do not sip.  This drink was given to you during your hospital pre-op appointment visit. Nothing else to drink after completing the Pre-Surgery  G2 : No candy, chewing gum or throat lozenges.    FOLLOW ANY ADDITIONAL PRE OP INSTRUCTIONS YOU RECEIVED FROM YOUR SURGEON'S OFFICE!!!   Oral Hygiene is also important to reduce your risk of infection.        Remember - BRUSH YOUR TEETH THE MORNING OF SURGERY WITH YOUR REGULAR TOOTHPASTE  Do NOT smoke after Midnight the night before surgery.  DIABETIC MEDICATIONS:  SEMAGLUTIDE (Ozempic) injecton on Saturdays: Last injection will be on Saturday 05-03-2023  empagliflozin (Jardiance) 12.5 daily      stop taking x 72 hours before surgery.  Last dose: Thursday 8-8-202   STOP TAKING all Vitamins, Herbs and supplements 1 week before your surgery.   Take ONLY these medicines the morning of surgery with A SIP OF WATER: amlodipine   If You have been diagnosed with Sleep Apnea - Bring CPAP mask and tubing day of surgery. We will provide you with a CPAP machine on the day of your surgery.                   You may not have any metal on your body including  jewelry, and body piercing  Do not wear , lotions, powders,  cologne, or deodorant  Men may shave face and neck.  Contacts, Hearing Aids, dentures or bridgework may not be worn into surgery. DENTURES WILL BE REMOVED PRIOR TO SURGERY PLEASE DO NOT  APPLY "Poly grip" OR ADHESIVES!!!  Patients discharged on the day of surgery will not be allowed to drive home.  Someone NEEDS to stay with you for the first 24 hours after anesthesia.  Do not bring your home medications to the hospital. The Pharmacy will dispense medications listed on your medication list to you during your admission in the Hospital.   Please read over the following fact sheets you were given: IF YOU HAVE QUESTIONS ABOUT YOUR PRE-OP INSTRUCTIONS, PLEASE CALL [phone].     Pre-operative 5 CHG Bath Instructions   You can play a key role in reducing the risk of infection after surgery. Your skin needs to be as free of germs as possible. You can reduce the number of germs on your skin by washing with CHG (chlorhexidine gluconate) soap before surgery. CHG is an antiseptic soap that kills germs and continues to kill germs even after washing.   DO NOT use if you have an allergy to chlorhexidine/CHG or antibacterial soaps. If your skin becomes reddened or irritated, stop using the CHG and notify one of our RNs at [phone]  Please shower with the CHG soap starting 4 days before surgery using the following schedule: START SHOWERS ON       [facility] 05-2023  NO LOTION OR DEODORANT   Please keep in mind the following:  DO NOT shave, including legs and underarms, starting the day of your first shower.   You may shave your face at any point before/day of surgery.   Place clean sheets on your bed the day you start using CHG soap. Use a clean washcloth (not used since being washed) for each shower. DO NOT sleep with pets once you start using the CHG.   CHG Shower Instructions:  If you choose to wash your hair and private area, wash first with your normal shampoo/soap.  After you use  shampoo/soap, rinse your hair and body thoroughly to remove shampoo/soap residue.  Turn the water OFF and apply about 3 tablespoons (45 ml) of CHG soap to a CLEAN washcloth.  Apply CHG soap ONLY FROM YOUR NECK DOWN TO YOUR TOES (washing for 3-5 minutes)  DO NOT use CHG soap on face, private areas, open wounds, or sores.  Pay special attention to the area where your surgery is being performed.  If you are having back surgery, having someone wash your back for you may be helpful.  Wait 2 minutes after CHG soap is applied, then you may rinse off the CHG soap.  Pat dry with a clean towel  Put on clean clothes/pajamas   If you choose to wear lotion, please use ONLY the CHG-compatible lotions on the back of this paper.     Additional instructions for the day of surgery: DO NOT APPLY any lotions, deodorants, cologne, or perfumes.   Put on clean/comfortable clothes.  Brush your teeth.  Ask your nurse before applying any prescription medications to the skin.      CHG Compatible Lotions   Aveeno Moisturizing lotion  Cetaphil Moisturizing Cream  Cetaphil Moisturizing Lotion  Clairol Herbal Essence Moisturizing Lotion, Dry Skin  Clairol Herbal Essence Moisturizing Lotion, Extra Dry Skin  Clairol Herbal Essence Moisturizing Lotion, Normal Skin  Curel Age Defying Therapeutic Moisturizing Lotion with Alpha Hydroxy  Curel Extreme Care Body Lotion  Curel Soothing Hands Moisturizing Hand Lotion  Curel Therapeutic Moisturizing Cream, Fragrance-Free  Curel Therapeutic Moisturizing Lotion, Fragrance-Free  Curel Therapeutic Moisturizing Lotion, Original Formula  Eucerin Daily Replenishing Lotion  Eucerin Dry Skin Therapy Plus Alpha Hydroxy Crme  Eucerin Dry Skin Therapy Plus Alpha Hydroxy Lotion  Eucerin Original Crme  Eucerin Original Lotion  Eucerin Plus Crme Eucerin Plus Lotion  Eucerin TriLipid Replenishing Lotion  Keri Anti-Bacterial Hand Lotion  Keri Deep Conditioning Original Lotion  Dry Skin Formula Softly Scented  Keri Deep Conditioning Original Lotion, Fragrance Free Sensitive Skin Formula  Keri Lotion Fast Absorbing Fragrance Free Sensitive Skin Formula  Keri Lotion Fast Absorbing Softly Scented Dry Skin Formula  Keri Original Lotion  Keri Skin Renewal Lotion Keri Silky Smooth Lotion  Keri Silky Smooth Sensitive Skin Lotion  Nivea Body Creamy Conditioning Oil  Nivea Body Extra Enriched Lotion  Nivea Body Original Lotion  Nivea Body Sheer Moisturizing Lotion Nivea Crme  Nivea Skin Firming Lotion  NutraDerm 30 Skin Lotion  NutraDerm Skin Lotion  NutraDerm Therapeutic Skin Cream  NutraDerm Therapeutic Skin Lotion  ProShield Protective Hand Cream  Provon moisturizing lotion   FAILURE TO FOLLOW THESE INSTRUCTIONS MAY RESULT IN THE CANCELLATION OF YOUR SURGERY  PATIENT SIGNATURE_________________________________  NURSE SIGNATURE__________________________________  ________________________________________________________________________      Nathan Carson    An incentive spirometer is a tool that can help keep your lungs clear and active. This tool measures how well you are filling your  lungs with each breath. Taking long deep breaths may help reverse or decrease the chance of developing breathing (pulmonary) problems (especially infection) following: A long period of time when you are unable to move or be active. BEFORE THE PROCEDURE  If the spirometer includes an indicator to show your best effort, your nurse or respiratory therapist will set it to a desired goal. If possible, sit up straight or lean slightly forward. Try not to slouch. Hold the incentive spirometer in an upright position. INSTRUCTIONS FOR USE  Sit on the edge of your bed if possible, or sit up as far as you can in bed or on a chair. Hold the incentive spirometer in an upright position. Breathe out normally. Place the mouthpiece in your mouth and seal your lips tightly around  it. Breathe in slowly and as deeply as possible, raising the piston or the ball toward the top of the column. Hold your breath for 3-5 seconds or for as long as possible. Allow the piston or ball to fall to the bottom of the column. Remove the mouthpiece from your mouth and breathe out normally. Rest for a few seconds and repeat Steps 1 through 7 at least 10 times every 1-2 hours when you are awake. Take your time and take a few normal breaths between deep breaths. The spirometer may include an indicator to show your best effort. Use the indicator as a goal to work toward during each repetition. After each set of 10 deep breaths, practice coughing to be sure your lungs are clear. If you have an incision (the cut made at the time of surgery), support your incision when coughing by placing a pillow or rolled up towels firmly against it. Once you are able to get out of bed, walk around indoors and cough well. You may stop using the incentive spirometer when instructed by your caregiver.  RISKS AND COMPLICATIONS Take your time so you do not get dizzy or light-headed. If you are in pain, you may need to take or ask for pain medication before doing incentive spirometry. It is harder to take a deep breath if you are having pain. AFTER USE Rest and breathe slowly and easily. It can be helpful to keep track of a log of your progress. Your caregiver can provide you with a simple table to help with this. If you are using the spirometer at home, follow these instructions: SEEK MEDICAL CARE IF:  You are having difficultly using the spirometer. You have trouble using the spirometer as often as instructed. Your pain medication is not giving enough relief while using the spirometer. You develop fever of 100.5 F (38.1 C) or higher.                                                                                                    SEEK IMMEDIATE MEDICAL CARE IF:  You cough up bloody sputum that had not been  present before. You develop fever of 102 F (38.9 C) or greater. You develop worsening pain at or near the incision site. MAKE SURE YOU:  Understand these  instructions. Will watch your condition. Will get help right away if you are not doing well or get worse. Document Released: 01/27/2007 Document Revised: 12/09/2011 Document Reviewed: 03/30/2007 [facility] Patient Information 2014 [place], [place].

## 2023-05-02 ENCOUNTER — Encounter (HOSPITAL_COMMUNITY)
Admission: RE | Admit: 2023-05-02 | Discharge: 2023-05-02 | Disposition: A | Discharge status: Home / Self Care | Payer: Federal, State, Local not specified - PPO | Source: Ambulatory Visit | Attending: Orthopedic Surgery | Admitting: Orthopedic Surgery

## 2023-05-02 ENCOUNTER — Ambulatory Visit (HOSPITAL_COMMUNITY)
Admission: RE | Admit: 2023-05-02 | Discharge: 2023-05-02 | Disposition: A | Discharge status: Home / Self Care | Payer: Self-pay | Source: Ambulatory Visit | Attending: Orthopedic Surgery | Admitting: Orthopedic Surgery

## 2023-05-02 ENCOUNTER — Encounter (HOSPITAL_COMMUNITY): Payer: Self-pay

## 2023-05-02 ENCOUNTER — Other Ambulatory Visit: Payer: Self-pay

## 2023-05-02 VITALS — BP 121/85 | HR 74 | Temp 98.6°F | Resp 20 | Ht 72.0 in | Wt 288.0 lb

## 2023-05-02 DIAGNOSIS — Z8616 Personal history of COVID-19: Secondary | ICD-10-CM | POA: Diagnosis not present

## 2023-05-02 DIAGNOSIS — N189 Chronic kidney disease, unspecified: Secondary | ICD-10-CM | POA: Diagnosis not present

## 2023-05-02 DIAGNOSIS — Z01818 Encounter for other preprocedural examination: Secondary | ICD-10-CM | POA: Diagnosis present

## 2023-05-02 DIAGNOSIS — I10 Essential (primary) hypertension: Secondary | ICD-10-CM

## 2023-05-02 DIAGNOSIS — K219 Gastro-esophageal reflux disease without esophagitis: Secondary | ICD-10-CM | POA: Insufficient documentation

## 2023-05-02 DIAGNOSIS — E1122 Type 2 diabetes mellitus with diabetic chronic kidney disease: Secondary | ICD-10-CM | POA: Insufficient documentation

## 2023-05-02 DIAGNOSIS — E119 Type 2 diabetes mellitus without complications: Secondary | ICD-10-CM

## 2023-05-02 DIAGNOSIS — I129 Hypertensive chronic kidney disease with stage 1 through stage 4 chronic kidney disease, or unspecified chronic kidney disease: Secondary | ICD-10-CM | POA: Insufficient documentation

## 2023-05-02 HISTORY — DX: Chronic kidney disease, unspecified: N18.9

## 2023-05-02 HISTORY — DX: Gastro-esophageal reflux disease without esophagitis: K21.9

## 2023-05-02 HISTORY — DX: Type 2 diabetes mellitus without complications: E11.9

## 2023-05-02 HISTORY — DX: Essential (primary) hypertension: I10

## 2023-05-02 LAB — BASIC METABOLIC PANEL
Anion gap: 10 (ref 5–15)
BUN: 19 mg/dL (ref 6–20)
CO2: 25 mmol/L (ref 22–32)
Calcium: 9.3 mg/dL (ref 8.9–10.3)
Chloride: 102 mmol/L (ref 98–111)
Creatinine, Ser: 1.51 mg/dL — ABNORMAL HIGH (ref 0.61–1.24)
GFR, Estimated: 54 mL/min — ABNORMAL LOW (ref >60)
Glucose, Bld: 117 mg/dL — ABNORMAL HIGH (ref 70–99)
Potassium: 4.3 mmol/L (ref 3.5–5.1)
Sodium: 137 mmol/L (ref 135–145)

## 2023-05-02 LAB — CBC
HCT: 44.5 % (ref 39.0–52.0)
Hemoglobin: 14.2 g/dL (ref 13.0–17.0)
MCH: 31.9 pg (ref 26.0–34.0)
MCHC: 31.9 g/dL (ref 30.0–36.0)
MCV: 100 fL (ref 80.0–100.0)
Platelets: 279 10*3/uL (ref 150–400)
RBC: 4.45 MIL/uL (ref 4.22–5.81)
RDW: 12.8 % (ref 11.5–15.5)
WBC: 7 10*3/uL (ref 4.0–10.5)
nRBC: 0 % (ref 0.0–0.2)

## 2023-05-02 LAB — SURGICAL PCR SCREEN
MRSA, PCR: NEGATIVE
Staphylococcus aureus: POSITIVE — AB

## 2023-05-02 LAB — HEMOGLOBIN A1C
Hgb A1c MFr Bld: 5.6 % (ref 4.8–5.6)
Mean Plasma Glucose: 114.02 mg/dL

## 2023-05-02 NOTE — Progress Notes (Signed)
Patient's PCR screen is positive for  [facility]. Appropriate notes have been placed on the patient's chart. This note has been routed to Dr. Turner Daniels and Sharyn Blitz for review. The Patient's surgery is currently scheduled for:   05-12-2023 at [facility].  Rudean Haskell, BSN, CVRN-BC   Pre-Surgical Testing Nurse [facility]  [phone]

## 2023-05-06 ENCOUNTER — Encounter (HOSPITAL_COMMUNITY): Payer: Self-pay

## 2023-05-06 NOTE — Progress Notes (Signed)
Choose an anesthesia record to view details        DISCUSSION: Nathan Carson is a 57 yo male who presents to PAT prior to R TKA. PMH significant for arthritis, HTN, OSA, DM, GERD, CKD, migraines.  No prior anesthesia complications.  Patient follows with PCP and Nephrology at the [facility]. sCr noted to be 1.51 on screening PAT labs. On chart review this appears consistent with recent labs drawn at the [place] in June. Diabetes and HTN are controlled.  Of note patient tested positive for COVID on 04/22/23. Had symptoms of post-nasal drip and a scratchy throat. He states that his symptoms lasted about a day and a half (ended ~7/24). He denied any symptoms to PAT RN. Since he has had resolution of symptoms >2 weeks, anticipate patient can proceed.   VS: BP 121/85 Comment: right arm sitting  Pulse 74   Temp 37 C (Oral)   Resp 20   Ht 6' (1.829 m)   Wt 130.6 kg   SpO2 99%   BMI 39.06 kg/m   PROVIDERS: Elizabeth Palau, FNP   LABS: Labs reviewed: Acceptable for surgery. (all labs ordered are listed, but only abnormal results are displayed)  Labs Reviewed  SURGICAL PCR SCREEN - Abnormal; Notable for the following components:      Result Value   Staphylococcus aureus POSITIVE (*)    All other components within normal limits  BASIC METABOLIC PANEL - Abnormal; Notable for the following components:   Glucose, Bld 117 (*)    Creatinine, Ser 1.51 (*)    GFR, Estimated 54 (*)    All other components within normal limits  HEMOGLOBIN A1C  CBC     IMAGES:  CXR 05/02/23  (Not read but personally reviewed and appears negative)   EKG:  NSR, rate 69 LVH   CV:  Past Medical History:  Diagnosis Date   Arthritis    knees, right shoulder   Diabetes mellitus without complication (HCC)    Family history of adverse reaction to anesthesia    pt's mother has hx. of post-op N/V   GERD (gastroesophageal reflux disease)    Hypertension    Migraines    Rotator cuff tear 11/29/2014   right  - recurrent   Sinus headache    Sleep apnea     Past Surgical History:  Procedure Laterality Date   ACHILLES TENDON SURGERY Right 10/06/2013   Procedure: RIGHT ACHILLES TENDON REPAIR;  Surgeon: Nestor Lewandowsky, MD;  Location: [facility];  Service: Orthopedics;  Laterality: Right;   KNEE ARTHROSCOPY Left    SHOULDER ARTHROSCOPY Right    SHOULDER ARTHROSCOPY WITH BICEPSTENOTOMY Right 12/05/2014   Procedure: SHOULDER ARTHROSCOPY WITH BICEPSTENOTOMY;  Surgeon: Jones Broom, MD;  Location: [facility];  Service: Orthopedics;  Laterality: Right;   SHOULDER ARTHROSCOPY WITH ROTATOR CUFF REPAIR Right 12/05/2014   Procedure: RIGHT SHOULDER ARTHROSCOPY REVISION WITH ROTATOR CUFF REPAIR ;  Surgeon: Jones Broom, MD;  Location: [facility];  Service: Orthopedics;  Laterality: Right;  Right shoulder arthroscopy revision arthrscopy rotator cuff repair    WISDOM TOOTH EXTRACTION      MEDICATIONS:  amLODipine (NORVASC) 5 MG tablet   docusate sodium (COLACE) 100 MG capsule   empagliflozin (JARDIANCE) 25 MG TABS tablet   oxyCODONE-acetaminophen (ROXICET) 5-325 MG per tablet   rosuvastatin (CRESTOR) 10 MG tablet   Semaglutide,0.25 or 0.5MG /DOS, 2 MG/3ML SOPN   No current facility-administered medications for this encounter.   Ubaldo Glassing, PA-C [facility]  Surgical Short Stay/Anesthesiology [facility] [address] Phone [phone] 05/06/2023 1:50 PM

## 2023-05-06 NOTE — Anesthesia Preprocedure Evaluation (Addendum)
Anesthesia Evaluation  Patient identified by MRN, date of birth, ID band Patient awake    Reviewed: Allergy & Precautions, NPO status , Patient's Chart, lab work & pertinent test results  Airway Mallampati: III  TM Distance: >3 FB Neck ROM: Full    Dental no notable dental hx.    Pulmonary sleep apnea    Pulmonary exam normal        Cardiovascular hypertension, Pt. on medications  Rhythm:Regular Rate:Normal     Neuro/Psych  Headaches  negative psych ROS   GI/Hepatic Neg liver ROS,GERD  ,,  Endo/Other  diabetes, Type 2, Oral Hypoglycemic Agents, Insulin Dependent    Renal/GU CRFRenal disease  negative genitourinary   Musculoskeletal  (+) Arthritis , Osteoarthritis,    Abdominal Normal abdominal exam  (+)   Peds  Hematology Lab Results      Component                Value               Date                      WBC                      7.0                 05/02/2023                HGB                      14.2                05/02/2023                HCT                      44.5                05/02/2023                MCV                      100.0               05/02/2023                PLT                      279                 05/02/2023             Lab Results      Component                Value               Date                      NA                       137                 05/02/2023                K  4.3                 05/02/2023                CO2                      25                  05/02/2023                GLUCOSE                  117 (H)             05/02/2023                BUN                      19                  05/02/2023                CREATININE               1.51 (H)            05/02/2023                CALCIUM                  9.3                 05/02/2023                GFRNONAA                 54 (L)              05/02/2023              Anesthesia Other  Findings   Reproductive/Obstetrics                             Anesthesia Physical Anesthesia Plan  ASA: 3  Anesthesia Plan: MAC, Spinal and Regional   Post-op Pain Management: Regional block*   Induction: Intravenous  PONV Risk Score and Plan: Ondansetron, Dexamethasone, Midazolam and Treatment may vary due to age or medical condition  Airway Management Planned: Simple Face Mask and Nasal Cannula  Additional Equipment: None  Intra-op Plan:   Post-operative Plan:   Informed Consent: I have reviewed the patients History and Physical, chart, labs and discussed the procedure including the risks, benefits and alternatives for the proposed anesthesia with the patient or authorized representative who has indicated his/her understanding and acceptance.     Dental advisory given  Plan Discussed with: CRNA  Anesthesia Plan Comments: (See PAT note from 8/2 by Sherlie Ban PA-C )        Anesthesia Quick Evaluation

## 2023-05-07 DIAGNOSIS — M1711 Unilateral primary osteoarthritis, right knee: Secondary | ICD-10-CM | POA: Diagnosis present

## 2023-05-07 NOTE — H&P (Cosign Needed Addendum)
TOTAL KNEE ADMISSION H&P  Patient is being admitted for right total knee arthroplasty.  Subjective:  Chief Complaint:right knee pain.  HPI: Nathan Carson, 57 y.o. male, has a history of pain and functional disability in the right knee due to trauma and arthritis and has failed non-surgical conservative treatments for greater than 12 weeks to includeNSAID's and/or analgesics, corticosteriod injections, viscosupplementation injections, flexibility and strengthening excercises, supervised PT with diminished ADL's post treatment, weight reduction as appropriate, and activity modification.  Onset of symptoms was gradual, starting 5 years ago with gradually worsening course since that time. The patient noted prior procedures on the knee to include  arthroscopy on the right knee(s).  Patient currently rates pain in the right knee(s) at 10 out of 10 with activity. Patient has night pain, worsening of pain with activity and weight bearing, pain that interferes with activities of daily living, pain with passive range of motion, crepitus, and joint swelling.  Patient has evidence of subchondral sclerosis and joint space narrowing by imaging studies.  There is no active infection.  Patient Active Problem List   Diagnosis Date Noted   Osteoarthritis of right knee 05/07/2023   Past Medical History:  Diagnosis Date   Arthritis    knees, right shoulder   CKD (chronic kidney disease)    Diabetes mellitus without complication (HCC)    Family history of adverse reaction to anesthesia    pt's mother has hx. of post-op N/V   GERD (gastroesophageal reflux disease)    Hypertension    Migraines    Rotator cuff tear 11/29/2014   right - recurrent   Sinus headache    Sleep apnea     Past Surgical History:  Procedure Laterality Date   ACHILLES TENDON SURGERY Right 10/06/2013   Procedure: RIGHT ACHILLES TENDON REPAIR;  Surgeon: Nestor Lewandowsky, MD;  Location: [facility];  Service: Orthopedics;   Laterality: Right;   KNEE ARTHROSCOPY Left    SHOULDER ARTHROSCOPY Right    SHOULDER ARTHROSCOPY WITH BICEPSTENOTOMY Right 12/05/2014   Procedure: SHOULDER ARTHROSCOPY WITH BICEPSTENOTOMY;  Surgeon: Jones Broom, MD;  Location: [facility];  Service: Orthopedics;  Laterality: Right;   SHOULDER ARTHROSCOPY WITH ROTATOR CUFF REPAIR Right 12/05/2014   Procedure: RIGHT SHOULDER ARTHROSCOPY REVISION WITH ROTATOR CUFF REPAIR ;  Surgeon: Jones Broom, MD;  Location: [facility];  Service: Orthopedics;  Laterality: Right;  Right shoulder arthroscopy revision arthrscopy rotator cuff repair    WISDOM TOOTH EXTRACTION      No current facility-administered medications for this encounter.   Current Outpatient Medications  Medication Sig Dispense Refill Last Dose   amLODipine (NORVASC) 5 MG tablet Take 5 mg by mouth daily.      empagliflozin (JARDIANCE) 25 MG TABS tablet Take 12.5 mg by mouth daily.      rosuvastatin (CRESTOR) 10 MG tablet Take 10 mg by mouth daily.      Semaglutide,0.25 or 0.5MG /DOS, 2 MG/3ML SOPN Inject 0.25 mg into the skin every Saturday.      docusate sodium (COLACE) 100 MG capsule Take 1 capsule (100 mg total) by mouth 3 (three) times daily as needed. (Patient not taking: Reported on 05/01/2023) 20 capsule 0 Not Taking   oxyCODONE-acetaminophen (ROXICET) 5-325 MG per tablet Take 1-2 tablets by mouth every 4 (four) hours as needed for severe pain. (Patient not taking: Reported on 05/01/2023) 60 tablet 0 Not Taking   Allergies  Allergen Reactions   Metformin And Related Nausea Only   Peanut-Containing  Drug Products Swelling    SWELLING FACE AND TONGUE   Sulfa Antibiotics Hives   Topiramate Nausea Only    Social History   Tobacco Use   Smoking status: Never   Smokeless tobacco: Never  Substance Use Topics   Alcohol use: Yes    Comment: occasionally    Family History  Problem Relation Age of Onset   Anesthesia problems Mother        post-op  N/V     Review of Systems  Constitutional: Negative.   HENT:  Positive for sinus pressure.   Eyes: Negative.   Respiratory: Negative.    Cardiovascular:        HTN  Gastrointestinal: Negative.   Genitourinary: Negative.   Musculoskeletal:  Positive for arthralgias and myalgias.  Skin: Negative.   Allergic/Immunologic: Negative.   Neurological:  Positive for headaches.  Hematological: Negative.   Psychiatric/Behavioral:  Positive for sleep disturbance.     Objective:  Physical Exam Constitutional:      Appearance: Normal appearance. He is normal weight.  HENT:     Head: Normocephalic and atraumatic.     Nose: Nose normal.  Eyes:     Pupils: Pupils are equal, round, and reactive to light.  Cardiovascular:     Pulses: Normal pulses.  Pulmonary:     Effort: Pulmonary effort is normal.  Musculoskeletal:        General: Tenderness present.     Cervical back: Normal range of motion and neck supple.     Comments: the patient has a range from 5 to 110 on the right.  Continued pain over the lateral and medial joint lines.  1+ effusion.  He continues to walk with a right-sided limp.  Calves are soft and nontender.  Patient's left knee has a range from 0-120.  Pain over the medial greater than lateral joint line.  Skin:    General: Skin is warm and dry.  Neurological:     General: No focal deficit present.     Mental Status: He is alert and oriented to person, place, and time. Mental status is at baseline.  Psychiatric:        Mood and Affect: Mood normal.        Behavior: Behavior normal.        Thought Content: Thought content normal.        Judgment: Judgment normal.     Vital signs in last 24 hours:    Labs:   Estimated body mass index is 39.06 kg/m as calculated from the following:   Height as of 05/02/23: 6' (1.829 m).   Weight as of 05/02/23: 130.6 kg.   Imaging Review Plain radiographs demonstrate  AP Rosenberg lateral and sunrise x-rays of the right knee  show that he has a new loss of cartilage laterally in May he had 5 mm of cartilage on Rosenberg's view laterally today he has 3 which is a significant change.      Assessment/Plan:  End stage arthritis, right knee   The patient history, physical examination, clinical judgment of the provider and imaging studies are consistent with end stage degenerative joint disease of the right knee(s) and total knee arthroplasty is deemed medically necessary. The treatment options including medical management, injection therapy arthroscopy and arthroplasty were discussed at length. The risks and benefits of total knee arthroplasty were presented and reviewed. The risks due to aseptic loosening, infection, stiffness, patella tracking problems, thromboembolic complications and other imponderables were discussed. The patient acknowledged the  explanation, agreed to proceed with the plan and consent was signed. Patient is being admitted for inpatient treatment for surgery, pain control, PT, OT, prophylactic antibiotics, VTE prophylaxis, progressive ambulation and ADL's and discharge planning. The patient is planning to be discharged home with home health services     Patient's anticipated LOS is less than 2 midnights, meeting these requirements: - Younger than 23 - Lives within 1 hour of care - Has a competent adult at home to recover with post-op recover - NO history of  - Chronic pain requiring opiods  - Diabetes  - Coronary Artery Disease  - Heart failure  - Heart attack  - Stroke  - DVT/VTE  - Cardiac arrhythmia  - Respiratory Failure/COPD  - Renal failure  - Anemia  - Advanced Liver disease

## 2023-05-12 ENCOUNTER — Encounter (HOSPITAL_COMMUNITY)
Admission: RE | Disposition: A | Discharge status: Home / Self Care | Payer: Self-pay | Source: Ambulatory Visit | Attending: Orthopedic Surgery

## 2023-05-12 ENCOUNTER — Encounter (HOSPITAL_COMMUNITY): Payer: Self-pay | Admitting: Orthopedic Surgery

## 2023-05-12 ENCOUNTER — Ambulatory Visit (HOSPITAL_COMMUNITY): Payer: Federal, State, Local not specified - PPO | Admitting: Medical

## 2023-05-12 ENCOUNTER — Ambulatory Visit (HOSPITAL_COMMUNITY)
Admission: RE | Admit: 2023-05-12 | Discharge: 2023-05-12 | Disposition: A | Discharge status: Home / Self Care | Payer: Federal, State, Local not specified - PPO | Source: Ambulatory Visit | Attending: Orthopedic Surgery | Admitting: Orthopedic Surgery

## 2023-05-12 ENCOUNTER — Other Ambulatory Visit: Payer: Self-pay

## 2023-05-12 ENCOUNTER — Ambulatory Visit (HOSPITAL_BASED_OUTPATIENT_CLINIC_OR_DEPARTMENT_OTHER): Payer: Federal, State, Local not specified - PPO | Admitting: Anesthesiology

## 2023-05-12 DIAGNOSIS — Z01818 Encounter for other preprocedural examination: Secondary | ICD-10-CM

## 2023-05-12 DIAGNOSIS — E119 Type 2 diabetes mellitus without complications: Secondary | ICD-10-CM | POA: Diagnosis not present

## 2023-05-12 DIAGNOSIS — I10 Essential (primary) hypertension: Secondary | ICD-10-CM

## 2023-05-12 DIAGNOSIS — Z7984 Long term (current) use of oral hypoglycemic drugs: Secondary | ICD-10-CM | POA: Insufficient documentation

## 2023-05-12 DIAGNOSIS — Z79899 Other long term (current) drug therapy: Secondary | ICD-10-CM | POA: Diagnosis not present

## 2023-05-12 DIAGNOSIS — M1711 Unilateral primary osteoarthritis, right knee: Secondary | ICD-10-CM | POA: Insufficient documentation

## 2023-05-12 DIAGNOSIS — Z794 Long term (current) use of insulin: Secondary | ICD-10-CM | POA: Diagnosis not present

## 2023-05-12 HISTORY — PX: TOTAL KNEE ARTHROPLASTY: SHX125

## 2023-05-12 LAB — GLUCOSE, CAPILLARY: Glucose-Capillary: 126 mg/dL — ABNORMAL HIGH (ref 70–99)

## 2023-05-12 SURGERY — ARTHROPLASTY, KNEE, TOTAL
Anesthesia: Monitor Anesthesia Care | Site: Knee | Laterality: Right

## 2023-05-12 MED ORDER — METHOCARBAMOL 500 MG PO TABS
ORAL_TABLET | ORAL | Status: AC
  Filled 2023-05-12: qty 1

## 2023-05-12 MED ORDER — ROPIVACAINE HCL 7.5 MG/ML IJ SOLN
INTRAMUSCULAR | Status: DC | PRN
  Administered 2023-05-12: 20 mL via PERINEURAL

## 2023-05-12 MED ORDER — FENTANYL CITRATE PF 50 MCG/ML IJ SOSY
100.0000 ug | PREFILLED_SYRINGE | Freq: Once | INTRAMUSCULAR | Status: AC
  Administered 2023-05-12: 100 ug via INTRAVENOUS
  Filled 2023-05-12: qty 2

## 2023-05-12 MED ORDER — ACETAMINOPHEN 10 MG/ML IV SOLN: 1000.0000 mg | Freq: Once | INTRAVENOUS | Status: DC | PRN

## 2023-05-12 MED ORDER — PROPOFOL 500 MG/50ML IV EMUL
INTRAVENOUS | Status: AC
  Filled 2023-05-12: qty 50

## 2023-05-12 MED ORDER — BUPIVACAINE LIPOSOME 1.3 % IJ SUSP: 20.0000 mL | Freq: Once | INTRAMUSCULAR | Status: DC

## 2023-05-12 MED ORDER — WATER FOR IRRIGATION, STERILE IR SOLN
Status: DC | PRN
  Administered 2023-05-12: 2000 mL

## 2023-05-12 MED ORDER — OXYCODONE HCL 5 MG PO TABS
ORAL_TABLET | ORAL | Status: AC
  Filled 2023-05-12: qty 2

## 2023-05-12 MED ORDER — LACTATED RINGERS IV SOLN: INTRAVENOUS | Status: DC

## 2023-05-12 MED ORDER — TIZANIDINE HCL 2 MG PO TABS: 2.0000 mg | ORAL_TABLET | Freq: Three times a day (TID) | ORAL | 0 refills | Status: DC | PRN

## 2023-05-12 MED ORDER — OXYCODONE HCL 5 MG PO TABS
5.0000 mg | ORAL_TABLET | ORAL | Status: DC | PRN
  Administered 2023-05-12: 10 mg via ORAL

## 2023-05-12 MED ORDER — BUPIVACAINE LIPOSOME 1.3 % IJ SUSP
INTRAMUSCULAR | Status: AC
  Filled 2023-05-12: qty 20

## 2023-05-12 MED ORDER — ACETAMINOPHEN 500 MG PO TABS
ORAL_TABLET | ORAL | Status: AC
  Filled 2023-05-12: qty 2

## 2023-05-12 MED ORDER — TRANEXAMIC ACID-NACL 1000-0.7 MG/100ML-% IV SOLN: 1000.0000 mg | Freq: Once | INTRAVENOUS | Status: DC

## 2023-05-12 MED ORDER — BUPIVACAINE-EPINEPHRINE (PF) 0.25% -1:200000 IJ SOLN
INTRAMUSCULAR | Status: DC | PRN
  Administered 2023-05-12: 30 mL

## 2023-05-12 MED ORDER — MIDAZOLAM HCL 5 MG/5ML IJ SOLN
INTRAMUSCULAR | Status: DC | PRN
  Administered 2023-05-12: 1 mg via INTRAVENOUS

## 2023-05-12 MED ORDER — BUPIVACAINE IN DEXTROSE 0.75-8.25 % IT SOLN
INTRATHECAL | Status: DC | PRN
  Administered 2023-05-12: 2 mL via INTRATHECAL

## 2023-05-12 MED ORDER — ONDANSETRON HCL 4 MG/2ML IJ SOLN
INTRAMUSCULAR | Status: AC
  Filled 2023-05-12: qty 2

## 2023-05-12 MED ORDER — CEFAZOLIN IN SODIUM CHLORIDE 3-0.9 GM/100ML-% IV SOLN
3.0000 g | INTRAVENOUS | Status: AC
  Administered 2023-05-12: 2 g via INTRAVENOUS
  Administered 2023-05-12: 1 g via INTRAVENOUS
  Filled 2023-05-12: qty 100

## 2023-05-12 MED ORDER — CHLORHEXIDINE GLUCONATE 0.12 % MT SOLN
15.0000 mL | Freq: Once | OROMUCOSAL | Status: AC
  Administered 2023-05-12: 15 mL via OROMUCOSAL

## 2023-05-12 MED ORDER — DEXAMETHASONE SODIUM PHOSPHATE 10 MG/ML IJ SOLN
INTRAMUSCULAR | Status: AC
  Filled 2023-05-12: qty 1

## 2023-05-12 MED ORDER — LACTATED RINGERS IV BOLUS
500.0000 mL | Freq: Once | INTRAVENOUS | Status: AC
  Administered 2023-05-12: 500 mL via INTRAVENOUS

## 2023-05-12 MED ORDER — SODIUM CHLORIDE (PF) 0.9 % IJ SOLN
INTRAMUSCULAR | Status: DC | PRN
  Administered 2023-05-12: 70 mL via INTRAVENOUS

## 2023-05-12 MED ORDER — MIDAZOLAM HCL 2 MG/2ML IJ SOLN
INTRAMUSCULAR | Status: AC
  Filled 2023-05-12: qty 2

## 2023-05-12 MED ORDER — TRANEXAMIC ACID-NACL 1000-0.7 MG/100ML-% IV SOLN
1000.0000 mg | INTRAVENOUS | Status: AC
  Administered 2023-05-12: 1000 mg via INTRAVENOUS
  Filled 2023-05-12: qty 100

## 2023-05-12 MED ORDER — MIDAZOLAM HCL 2 MG/2ML IJ SOLN
2.0000 mg | Freq: Once | INTRAMUSCULAR | Status: AC
  Administered 2023-05-12: 2 mg via INTRAVENOUS
  Filled 2023-05-12: qty 2

## 2023-05-12 MED ORDER — OXYCODONE HCL 5 MG PO TABS: 5.0000 mg | ORAL_TABLET | ORAL | 0 refills | Status: AC | PRN

## 2023-05-12 MED ORDER — TRANEXAMIC ACID 1000 MG/10ML IV SOLN
2000.0000 mg | INTRAVENOUS | Status: DC
  Filled 2023-05-12: qty 20

## 2023-05-12 MED ORDER — BUPIVACAINE LIPOSOME 1.3 % IJ SUSP
INTRAMUSCULAR | Status: DC | PRN
  Administered 2023-05-12: 20 mL

## 2023-05-12 MED ORDER — METHOCARBAMOL 500 MG PO TABS
500.0000 mg | ORAL_TABLET | Freq: Four times a day (QID) | ORAL | Status: DC | PRN
  Administered 2023-05-12: 500 mg via ORAL

## 2023-05-12 MED ORDER — LACTATED RINGERS IV BOLUS
250.0000 mL | Freq: Once | INTRAVENOUS | Status: AC
  Administered 2023-05-12: 250 mL via INTRAVENOUS

## 2023-05-12 MED ORDER — ORAL CARE MOUTH RINSE: 15.0000 mL | Freq: Once | OROMUCOSAL | Status: AC

## 2023-05-12 MED ORDER — DEXAMETHASONE SODIUM PHOSPHATE 10 MG/ML IJ SOLN
INTRAMUSCULAR | Status: DC | PRN
  Administered 2023-05-12: 10 mg
  Administered 2023-05-12: 4 mg

## 2023-05-12 MED ORDER — POVIDONE-IODINE 10 % EX SWAB: 2.0000 | Freq: Once | CUTANEOUS | Status: DC

## 2023-05-12 MED ORDER — FENTANYL CITRATE (PF) 100 MCG/2ML IJ SOLN
INTRAMUSCULAR | Status: AC
  Filled 2023-05-12: qty 2

## 2023-05-12 MED ORDER — SODIUM CHLORIDE (PF) 0.9 % IJ SOLN
INTRAMUSCULAR | Status: AC
  Filled 2023-05-12: qty 20

## 2023-05-12 MED ORDER — SODIUM CHLORIDE (PF) 0.9 % IJ SOLN
INTRAMUSCULAR | Status: AC
  Filled 2023-05-12: qty 50

## 2023-05-12 MED ORDER — 0.9 % SODIUM CHLORIDE (POUR BTL) OPTIME
TOPICAL | Status: DC | PRN
  Administered 2023-05-12: 1000 mL

## 2023-05-12 MED ORDER — FENTANYL CITRATE (PF) 100 MCG/2ML IJ SOLN
INTRAMUSCULAR | Status: DC | PRN
  Administered 2023-05-12: 50 ug via INTRAVENOUS

## 2023-05-12 MED ORDER — TRANEXAMIC ACID 1000 MG/10ML IV SOLN
INTRAVENOUS | Status: DC | PRN
  Administered 2023-05-12: 2000 mg via TOPICAL

## 2023-05-12 MED ORDER — PHENYLEPHRINE HCL-NACL 20-0.9 MG/250ML-% IV SOLN
INTRAVENOUS | Status: DC | PRN
  Administered 2023-05-12: 20 ug/min via INTRAVENOUS

## 2023-05-12 MED ORDER — ACETAMINOPHEN 325 MG PO TABS: 325.0000 mg | ORAL_TABLET | Freq: Four times a day (QID) | ORAL | Status: DC | PRN

## 2023-05-12 MED ORDER — FENTANYL CITRATE PF 50 MCG/ML IJ SOSY: 25.0000 ug | PREFILLED_SYRINGE | INTRAMUSCULAR | Status: DC | PRN

## 2023-05-12 MED ORDER — BUPIVACAINE-EPINEPHRINE 0.25% -1:200000 IJ SOLN
INTRAMUSCULAR | Status: AC
  Filled 2023-05-12: qty 1

## 2023-05-12 MED ORDER — SODIUM CHLORIDE (PF) 0.9 % IJ SOLN
INTRAMUSCULAR | Status: AC
  Filled 2023-05-12: qty 10

## 2023-05-12 MED ORDER — ONDANSETRON HCL 4 MG/2ML IJ SOLN
INTRAMUSCULAR | Status: DC | PRN
  Administered 2023-05-12: 4 mg via INTRAVENOUS

## 2023-05-12 MED ORDER — PROPOFOL 500 MG/50ML IV EMUL
INTRAVENOUS | Status: DC | PRN
  Administered 2023-05-12: 25 ug/kg/min via INTRAVENOUS

## 2023-05-12 MED ORDER — METHOCARBAMOL 500 MG IVPB - SIMPLE MED: 500.0000 mg | Freq: Four times a day (QID) | INTRAVENOUS | Status: DC | PRN

## 2023-05-12 MED ORDER — PROPOFOL 1000 MG/100ML IV EMUL
INTRAVENOUS | Status: AC
  Filled 2023-05-12: qty 100

## 2023-05-12 MED ORDER — ASPIRIN 325 MG PO TBEC: 325.0000 mg | DELAYED_RELEASE_TABLET | Freq: Two times a day (BID) | ORAL | 0 refills | Status: DC

## 2023-05-12 MED ORDER — SODIUM CHLORIDE 0.9 % IR SOLN
Status: DC | PRN
  Administered 2023-05-12: 1000 mL

## 2023-05-12 MED ORDER — HYDROMORPHONE HCL 1 MG/ML IJ SOLN: 0.5000 mg | INTRAMUSCULAR | Status: DC | PRN

## 2023-05-12 MED ORDER — CEFAZOLIN SODIUM 1 G IJ SOLR
INTRAMUSCULAR | Status: AC
  Filled 2023-05-12: qty 10

## 2023-05-12 MED ORDER — PROPOFOL 10 MG/ML IV BOLUS
INTRAVENOUS | Status: DC | PRN
  Administered 2023-05-12 (×3): 20 mg via INTRAVENOUS

## 2023-05-12 SURGICAL SUPPLY — 51 items
ATTUNE MED DOME PAT 41 KNEE (Knees) IMPLANT
ATTUNE PS FEM RT SZ 7 CEM KNEE (Femur) IMPLANT
ATTUNE PSRP INSR SZ7 5 KNEE (Insert) IMPLANT
BAG COUNTER SPONGE SURGICOUNT (BAG) IMPLANT
BAG DECANTER FOR FLEXI CONT (MISCELLANEOUS) ×1 IMPLANT
BAG SPEC THK2 15X12 ZIP CLS (MISCELLANEOUS) ×1
BAG SPNG CNTER NS LX DISP (BAG) ×1
BAG ZIPLOCK 12X15 (MISCELLANEOUS) ×1 IMPLANT
BASE TIBIAL ATTUNE KNEE SZ9 (Knees) IMPLANT
BLADE SAG 18X100X1.27 (BLADE) ×1 IMPLANT
BLADE SAW SGTL 11.0X1.19X90.0M (BLADE) ×1 IMPLANT
BLADE SAW SGTL 18X1.27X75 (BLADE) IMPLANT
BLADE SURG SZ10 CARB STEEL (BLADE) ×2 IMPLANT
BNDG CMPR MED 10X6 ELC LF (GAUZE/BANDAGES/DRESSINGS) ×1
BNDG ELASTIC 6X10 VLCR STRL LF (GAUZE/BANDAGES/DRESSINGS) ×1 IMPLANT
BOWL SMART MIX CTS (DISPOSABLE) ×1 IMPLANT
BSPLAT TIB 9 CMNT ROT PLAT STR (Knees) ×1 IMPLANT
CEMENT HV SMART SET (Cement) ×4 IMPLANT
COVER SURGICAL LIGHT HANDLE (MISCELLANEOUS) ×1 IMPLANT
DRAPE INCISE IOBAN 66X45 STRL (DRAPES) IMPLANT
DRAPE U-SHAPE 47X51 STRL (DRAPES) ×1 IMPLANT
DRSG AQUACEL AG ADV 3.5X10 (GAUZE/BANDAGES/DRESSINGS) ×1 IMPLANT
DURAPREP 26ML APPLICATOR (WOUND CARE) ×1 IMPLANT
ELECT REM PT RETURN 15FT ADLT (MISCELLANEOUS) ×1 IMPLANT
GLOVE BIO SURGEON STRL SZ7.5 (GLOVE) ×1 IMPLANT
GLOVE BIO SURGEON STRL SZ8.5 (GLOVE) ×1 IMPLANT
GLOVE BIOGEL PI IND STRL 8 (GLOVE) ×1 IMPLANT
GLOVE BIOGEL PI IND STRL 9 (GLOVE) ×1 IMPLANT
GOWN STRL REUS W/ TWL XL LVL3 (GOWN DISPOSABLE) ×2 IMPLANT
GOWN STRL REUS W/TWL XL LVL3 (GOWN DISPOSABLE) ×2
HANDPIECE INTERPULSE COAX TIP (DISPOSABLE) ×1
HOOD PEEL AWAY T7 (MISCELLANEOUS) ×3 IMPLANT
KIT TURNOVER KIT A (KITS) IMPLANT
NDL HYPO 21X1.5 SAFETY (NEEDLE) ×2 IMPLANT
NEEDLE HYPO 21X1.5 SAFETY (NEEDLE) ×2 IMPLANT
NS IRRIG 1000ML POUR BTL (IV SOLUTION) ×1 IMPLANT
PACK TOTAL KNEE CUSTOM (KITS) ×1 IMPLANT
PIN STEINMAN FIXATION KNEE (PIN) IMPLANT
PROTECTOR NERVE ULNAR (MISCELLANEOUS) ×1 IMPLANT
SET HNDPC FAN SPRY TIP SCT (DISPOSABLE) ×1 IMPLANT
SPIKE FLUID TRANSFER (MISCELLANEOUS) ×3 IMPLANT
SUT VIC AB 1 CTX 36 (SUTURE) ×1
SUT VIC AB 1 CTX36XBRD ANBCTR (SUTURE) ×1 IMPLANT
SUT VICRYL+ 3-0 36IN CT-1 (SUTURE) ×3 IMPLANT
SYR CONTROL 10ML LL (SYRINGE) ×2 IMPLANT
TIBIAL BASE ATTUNE KNEE SZ9 (Knees) ×1 IMPLANT
TRAY CATH INTERMITTENT SS 16FR (CATHETERS) IMPLANT
TRAY FOLEY MTR SLVR 16FR STAT (SET/KITS/TRAYS/PACK) ×1 IMPLANT
TUBE SUCTION HIGH CAP CLEAR NV (SUCTIONS) ×1 IMPLANT
WATER STERILE IRR 1000ML POUR (IV SOLUTION) ×2 IMPLANT
WRAP KNEE MAXI GEL POST OP (GAUZE/BANDAGES/DRESSINGS) ×1 IMPLANT

## 2023-05-12 NOTE — Transfer of Care (Signed)
Immediate Anesthesia Transfer of Care Note  Patient: Nathan Carson  Procedure(s) Performed: RIGHT TOTAL KNEE ARTHROPLASTY (Right: Knee)  Patient Location: PACU  Anesthesia Type:Spinal  Level of Consciousness: drowsy  Airway & Oxygen Therapy: Patient Spontanous Breathing and Patient connected to face mask oxygen  Post-op Assessment: Report given to RN and Post -op Vital signs reviewed and stable  Post vital signs: Reviewed and stable  Last Vitals:  Vitals Value Taken Time  BP 118/71 05/12/23 1436  Temp    Pulse 63 05/12/23 1439  Resp 12 05/12/23 1439  SpO2 96 % 05/12/23 1439  Vitals shown include unfiled device data.  Last Pain:  Vitals:   05/12/23 1132  TempSrc:   PainSc: 0-No pain         Complications: No notable events documented.

## 2023-05-12 NOTE — Anesthesia Procedure Notes (Signed)
Anesthesia Regional Block: Adductor canal block   Pre-Anesthetic Checklist: , timeout performed,  Correct Patient, Correct Site, Correct Laterality,  Correct Procedure, Correct Position, site marked,  Risks and benefits discussed,  Surgical consent,  Pre-op evaluation,  At surgeon's request and post-op pain management  Laterality: Right  Prep: Dura Prep       Needles:  Injection technique: Single-shot  Needle Type: Echogenic Stimulator Needle     Needle Length: 10cm  Needle Gauge: 20     Additional Needles:   Procedures:,,,, ultrasound used (permanent image in chart),,    Narrative:  Start time: 05/12/2023 11:20 AM End time: 05/12/2023 11:25 AM Injection made incrementally with aspirations every 5 mL.  Performed by: Personally  Anesthesiologist: Atilano Median, DO  Additional Notes: Patient identified. Risks/Benefits/Options discussed with patient including but not limited to bleeding, infection, nerve damage, failed block, incomplete pain control. Patient expressed understanding and wished to proceed. All questions were answered. Sterile technique was used throughout the entire procedure. Please see nursing notes for vital signs. Aspirated in 5cc intervals with injection for negative confirmation. Patient was given instructions on fall risk and not to get out of bed. All questions and concerns addressed with instructions to call with any issues or inadequate analgesia.

## 2023-05-12 NOTE — Progress Notes (Signed)
Orthopedic Tech Progress Note Patient Details:  Nathan Carson 06/12/1966 [id number]  Dropped off boone foam to bedside with PACU RN. Ortho Devices Type of Ortho Device: Bone foam zero knee Ortho Device/Splint Interventions: Ordered   Post Interventions Patient Tolerated: Well  [name] 05/12/2023, 3:46 PM

## 2023-05-12 NOTE — Discharge Instructions (Signed)

## 2023-05-12 NOTE — Op Note (Signed)
PATIENT ID:      Nathan Carson  MRN:     [id number] DOB/AGE:    [birth year] / 57 y.o.       OPERATIVE REPORT   DATE OF PROCEDURE:  05/12/2023      PREOPERATIVE DIAGNOSIS:   RIGHT KNEE OSTEOARTHRITIS      Estimated body mass index is 39.06 kg/m as calculated from the following:   Height as of this encounter: 6' (1.829 m).   Weight as of this encounter: 130.6 kg.                                                       POSTOPERATIVE DIAGNOSIS:   Same                                                                  PROCEDURE:  Procedure(s): RIGHT TOTAL KNEE ARTHROPLASTY Using DepuyAttune RP implants #7R Femur, #9Tibia, 5 mm Attune RP bearing, 41 Patella    SURGEON: Nestor Lewandowsky  ASSISTANT:   Tomi Likens. [employer]   (Present and scrubbed throughout the case, critical for assistance with exposure, retraction, instrumentation, and closure.)        ANESTHESIA: Spinal, 20cc Exparel, 50cc 0.25% Marcaine EBL: 300 cc FLUID REPLACEMENT: 1600 cc crystaloid TOURNIQUET: DRAINS: None TRANEXAMIC ACID: 1gm IV, 2gm topical COMPLICATIONS:  None         INDICATIONS FOR PROCEDURE: The patient has  RIGHT KNEE OSTEOARTHRITIS, Var deformities, XR shows bone on bone arthritis, lateral subluxation of tibia. Patient has failed all conservative measures including anti-inflammatory medicines, narcotics, attempts at exercise and weight loss, cortisone injections and viscosupplementation.  Risks and benefits of surgery have been discussed, questions answered.   DESCRIPTION OF PROCEDURE: The patient identified by armband, received  IV antibiotics, in the holding area at [facility]. Patient taken to the operating room, appropriate anesthetic monitors were attached, and Spinal anesthesia was  induced. IV Tranexamic acid was given. Lateral post and 2 surefoot positioners applied to the table, the lower extremity was then prepped and draped in usual sterile fashion from the toes to the high thigh. Time-out  procedure was performed. Tomi Likens. [facility] PAC, was present and scrubbed throughout the case, critical for assistance with, positioning, exposure, retraction, instrumentation, and closure.The skin and subcutaneous tissue along the incision was injected with 20 cc of a mixture of 20cc Exparel and 30cc Marcaine 50cc saline solution, using a 21-gauge by 1-1/2 inch needle. We began the operation, with the knee flexed 130 degrees, by making the anterior midline incision starting at handbreadth above the patella going over the patella 1 cm medial to and 4 cm distal to the tibial tubercle. Small bleeders in the skin and the subcutaneous tissue identified and cauterized. Transverse retinaculum was incised and reflected medially and a medial parapatellar arthrotomy was accomplished. the patella was everted and theprepatellar fat pad resected. The superficial medial collateral ligament was then elevated from anterior to posterior along the proximal flare of the tibia and anterior half of the menisci resected. The knee was hyperflexed exposing bone on bone arthritis. Peripheral and notch  osteophytes as well as the cruciate ligaments were then resected. We continued to work our way around posteriorly along the proximal tibia, and externally rotated the tibia subluxing it out from underneath the femur. A McHale PCL retractor was placed through the notch, a lateral Hohmann retractor, and anterolateral small homan retractor placed. We then entered the proximal tibia with the Depuy starter drill in line with the axis of the tibia followed by an intramedullary guide rod and 3-degree posterior slope cutting guide. The tibial cutting guide, was pinned into place allowing resection of 4 mm of bone medially and 10 mm of bone laterally. Satisfied with the tibial resection, we then entered the distal femur 2 mm anterior to the PCL origin with the starter drill, followed by the intramedullary guide rod and applied the distal femoral cutting  guide set at 9 mm, with 5 degrees of valgus. This was pinned along the epicondylar axis. At this point, the distal femoral cut was accomplished without difficulty. We then sized for a #7R femoral component and pinned the chamfer guide in 3 degrees of external rotation. The anterior, posterior, and chamfer cuts were accomplished without difficulty followed by the Attune RP box cutting guide and the box cut. We also removed posterior osteophytes from the posterior femoral condyles. The posterior capsule was injected with Exparel solution. The knee was brought into full extension. We checked our extension gap and fit a 5 mm trial lollipop. Distracting in extension with a lamina spreader,  bleeders in the posterior capsule, Posterior medial and posterior lateral gutter were cauterized.  The transexamic acid-soaked sponge was then placed in the gap of the knee in extension. The knee was flexed 30. The posterior patella cut was accomplished with the 9.5 mm Attune cutting guide, sized for a 41 mm dome, and the fixation pegs drilled.The knee was then once again hyperflexed exposing the proximal tibia. We sized for a # 9 tibial base plate, applied the smokestack and the conical reamer followed by the the Delta fin keel punch. We then hammered into place the Attune RP trial femoral component, drilled the lugs, inserted a  5 mm trial bearing, trial patellar button, and took the knee through range of motion from 0-130 degrees. Medial and lateral ligamentous stability was checked. No thumb pressure was required for patellar Tracking.  All trial components were removed, mating surfaces irrigated with pulse lavage, and dried with suction and sponges. 10 cc of the Exparel solution was applied to the cancellus bone of the patella distal femur and proximal tibia.  After waiting 30 seconds, the bony surfaces were again, dried with sponges. A double batch of DePuy HV cement was mixed and applied to all bony metallic mating surfaces  except for the posterior condyles of the femur itself. In order, we hammered into place the tibial tray and removed excess cement, the femoral component and removed excess cement. The final Attune RP bearing was inserted, and the knee brought to full extension with compression. The patellar button was clamped into place, and excess cement removed. The knee was held at 30 flexion with compression using the second surefoot, while the cement cured. The wound was irrigated out with normal saline solution pulse lavage. The rest of the Exparel was injected into the parapatellar arthrotomy, subcutaneous tissues, and periosteal tissues. The parapatellar arthrotomy was closed with running #1 Vicryl suture. The subcutaneous tissue with 3-0 undyed Vicryl suture, and the skin with running 3-0 SQ vicryl. An Aquacil dressing and Ace wrap were applied.  The patient was taken to recovery room without difficulty.   Nestor Lewandowsky 05/12/2023, 11:21 AM

## 2023-05-12 NOTE — Anesthesia Procedure Notes (Signed)
Spinal  Patient location during procedure: OR Start time: 05/12/2023 12:26 PM End time: 05/12/2023 12:28 PM Staffing Performed: anesthesiologist  Anesthesiologist: Atilano Median, DO Performed by: Atilano Median, DO Authorized by: Atilano Median, DO   Preanesthetic Checklist Completed: patient identified, IV checked, site marked, risks and benefits discussed, surgical consent, monitors and equipment checked, pre-op evaluation and timeout performed Spinal Block Patient position: sitting Prep: DuraPrep Patient monitoring: heart rate, cardiac monitor, continuous pulse ox and blood pressure Approach: midline Location: L4-5 Injection technique: single-shot Needle Needle type: Pencan  Needle gauge: 24 G Needle length: 10 cm Assessment Events: CSF return Additional Notes Patient identified. Risks/Benefits/Options discussed with patient including but not limited to bleeding, infection, nerve damage, paralysis, failed block, incomplete pain control, headache, blood pressure changes, nausea, vomiting, reactions to medications, itching and postpartum back pain. Confirmed with bedside nurse the patient's most recent platelet count. Confirmed with patient that they are not currently taking any anticoagulation, have any bleeding history or any family history of bleeding disorders. Patient expressed understanding and wished to proceed. All questions were answered. Sterile technique was used throughout the entire procedure. Please see nursing notes for vital signs. Warning signs of high block given to the patient including shortness of breath, tingling/numbness in hands, complete motor block, or any concerning symptoms with instructions to call for help. Patient was given instructions on fall risk and not to get out of bed. All questions and concerns addressed with instructions to call with any issues or inadequate analgesia.

## 2023-05-12 NOTE — Evaluation (Signed)
Physical Therapy Evaluation Patient Details Name: Nathan Carson MRN: [id number] DOB: 07-10-1966 Today's Date: 05/12/2023  History of Present Illness  57 yo male presents to therapy s/p R TKA on 05/12/2023 due to failure of conservative measures. Pt PMH includes but is not limited to: CKD, DM II, GERD, HTN, R RTC tear s/p repair and  OSA.  Clinical Impression    Jakwan Mindel is a 57 y.o. male  POD 0 s/p R TKA. Patient reports IND with mobility at baseline. Patient is now limited by functional impairments (see PT problem list below) and requires CGA and cues for transfers and gait with RW. Patient was able to ambulate 40 feet with RW and CGA and cues for safe walker management. Patient educated on safe sequencing for stair mobility, fall risk prevention, pain management and goal, use of Cp/ice and car transfers pt and spouse verbalized understanding of safe guarding position for people assisting with mobility. Patient instructed in exercises to facilitate ROM and circulation reviewed and HO provided. Patient will benefit from continued skilled PT interventions to address impairments and progress towards PLOF. Patient has met mobility goals at adequate level for discharge home with family support and [facility] services; will continue to follow if pt continues acute stay to progress towards Mod I goals.       If plan is discharge home, recommend the following: A little help with walking and/or transfers;A little help with bathing/dressing/bathroom;Assistance with feeding;Help with stairs or ramp for entrance;Assist for transportation   Can travel by private vehicle        Equipment Recommendations Rolling walker (2 wheels) (provided and adjusted)  Recommendations for Other Services       Functional Status Assessment Patient has had a recent decline in their functional status and demonstrates the ability to make significant improvements in function in a reasonable and predictable amount of time.      Precautions / Restrictions Precautions Precautions: Knee;Fall Restrictions Weight Bearing Restrictions: No      Mobility  Bed Mobility Overal bed mobility: Needs Assistance Bed Mobility: Supine to Sit     Supine to sit: Supervision     General bed mobility comments: min cues    Transfers Overall transfer level: Needs assistance Equipment used: Rolling walker (2 wheels) Transfers: Sit to/from Stand Sit to Stand: Contact guard assist           General transfer comment: min cues for proper EU placement    Ambulation/Gait Ambulation/Gait assistance: Contact guard assist Gait Distance (Feet): 40 Feet Assistive device: Rolling walker (2 wheels) Gait Pattern/deviations: Step-to pattern, Antalgic, Trunk flexed Gait velocity: decreased     General Gait Details: step to pattern with B UE support to offload R LE in stance phase limited R knee flexion  Stairs Stairs: Yes Stairs assistance: Contact guard assist Stair Management: Two rails Number of Stairs: 2 General stair comments: cues for proper sequencing and tecnique with step navigation with B handrails and pt ed provided on use of RW to navigate one step to enter home  Wheelchair Mobility     Tilt Bed    Modified Rankin (Stroke Patients Only)       Balance                                             Pertinent Vitals/Pain Pain Assessment Pain Assessment: 0-10 Pain Score: 8  (4/10 at  rest) Pain Location: R knee Pain Descriptors / Indicators: Aching, Constant, Discomfort, Operative site guarding Pain Intervention(s): Limited activity within patient's tolerance, Monitored during session, Premedicated before session, Repositioned, Ice applied    Home Living Family/patient expects to be discharged to:: Private residence Living Arrangements: Spouse/significant other Available Help at Discharge: Family Type of Home: House Home Access: Stairs to enter Entrance Stairs-Rails:  None Entrance Stairs-Number of Steps: 1 Alternate Level Stairs-Number of Steps: flight Home Layout: Two level;Other (Comment) (1/2 bath on 1st floor and able to live on first floor) Home Equipment: None      Prior Function Prior Level of Function : Independent/Modified Independent;Driving             Mobility Comments: IND with all ADLs, self care tasks and IADLs,       Extremity/Trunk Assessment        Lower Extremity Assessment Lower Extremity Assessment: RLE deficits/detail RLE Deficits / Details: ankle DF/PF 5/5; SLR limited hip flexion < 10 degree lag RLE Sensation: decreased light touch (proximal LEs and groin)    Cervical / Trunk Assessment Cervical / Trunk Assessment:  (wfl)  Communication   Communication Communication: No apparent difficulties  Cognition Arousal: Alert Behavior During Therapy: WFL for tasks assessed/performed Overall Cognitive Status: Within Functional Limits for tasks assessed                                          General Comments      Exercises Total Joint Exercises Ankle Circles/Pumps: AROM, Both, 20 reps Quad Sets: AROM, Right, 5 reps Short Arc Quad: AROM, Right, 5 reps Heel Slides: AROM, Right, 5 reps Hip ABduction/ADduction: AROM, Right, 5 reps Straight Leg Raises: AROM, Right, 5 reps Knee Flexion: AROM, Right, 5 reps, Seated   Assessment/Plan    PT Assessment Patient needs continued PT services  PT Problem List Decreased strength;Decreased range of motion;Decreased activity tolerance;Decreased balance;Decreased mobility;Decreased coordination;Pain       PT Treatment Interventions DME instruction;Gait training;Stair training;Functional mobility training;Therapeutic activities;Therapeutic exercise;Balance training;Neuromuscular re-education;Modalities;Patient/family education    PT Goals (Current goals can be found in the Care Plan section)  Acute Rehab PT Goals Patient Stated Goal: to be able to walk  without pain PT Goal Formulation: With patient Time For Goal Achievement: 05/26/23 Potential to Achieve Goals: Good    Frequency 7X/week     Co-evaluation               AM-PAC PT "6 Clicks" Mobility  Outcome Measure Help needed turning from your back to your side while in a flat bed without using bedrails?: None Help needed moving from lying on your back to sitting on the side of a flat bed without using bedrails?: A Little Help needed moving to and from a bed to a chair (including a wheelchair)?: A Little Help needed standing up from a chair using your arms (e.g., wheelchair or bedside chair)?: A Little Help needed to walk in hospital room?: A Little Help needed climbing 3-5 steps with a railing? : A Little 6 Click Score: 19    End of Session Equipment Utilized During Treatment: Gait belt Activity Tolerance: Patient tolerated treatment well Patient left: in chair;with call bell/phone within reach;with family/visitor present Nurse Communication: Mobility status;Other (comment) (pt readiness for d/c from PT standpoint) PT Visit Diagnosis: Unsteadiness on feet (R26.81);Other abnormalities of gait and mobility (R26.89);Muscle weakness (generalized) (M62.81);History of falling (  Z91.81);Difficulty in walking, not elsewhere classified (R26.2);Pain Pain - Right/Left: Right Pain - part of body: Knee;Leg    Time: 4098-1191 PT Time Calculation (min) (ACUTE ONLY): 42 min   Charges:   PT Evaluation $PT Eval Low Complexity: 1 Low PT Treatments $Gait Training: 8-22 mins $Therapeutic Exercise: 8-22 mins PT General Charges $$ ACUTE PT VISIT: 1 Visit         Johnny Bridge, PT Acute Rehab   Jacqualyn Posey 05/12/2023, 5:58 PM

## 2023-05-12 NOTE — Interval H&P Note (Signed)
History and Physical Interval Note:  05/12/2023 11:20 AM  Nathan Carson  has presented today for surgery, with the diagnosis of RIGHT KNEE OSTEOARTHRITIS.  The various methods of treatment have been discussed with the patient and family. After consideration of risks, benefits and other options for treatment, the patient has consented to  Procedure(s): RIGHT TOTAL KNEE ARTHROPLASTY (Right) as a surgical intervention.  The patient's history has been reviewed, patient examined, no change in status, stable for surgery.  I have reviewed the patient's chart and labs.  Questions were answered to the patient's satisfaction.     Nestor Lewandowsky

## 2023-05-13 ENCOUNTER — Encounter (HOSPITAL_COMMUNITY): Payer: Self-pay | Admitting: Orthopedic Surgery

## 2023-05-13 NOTE — Anesthesia Postprocedure Evaluation (Signed)
Anesthesia Post Note  Patient: Nathan Carson  Procedure(s) Performed: RIGHT TOTAL KNEE ARTHROPLASTY (Right: Knee)     Patient location during evaluation: PACU Anesthesia Type: Regional, MAC and Spinal Level of consciousness: awake and alert Pain management: pain level controlled Vital Signs Assessment: post-procedure vital signs reviewed and stable Respiratory status: spontaneous breathing, nonlabored ventilation, respiratory function stable and patient connected to nasal cannula oxygen Cardiovascular status: stable and blood pressure returned to baseline Postop Assessment: no apparent nausea or vomiting Anesthetic complications: no   No notable events documented.  Last Vitals:  Vitals:   05/12/23 1700 05/12/23 1704  BP: (!) 128/109 (!) 140/87  Pulse:  81  Resp:    Temp:    SpO2:  99%    Last Pain:  Vitals:   05/12/23 1704  TempSrc:   PainSc: 0-No pain                 Earl Lites P 

## 2023-07-24 ENCOUNTER — Other Ambulatory Visit: Payer: Self-pay | Admitting: Orthopedic Surgery

## 2023-08-07 NOTE — Progress Notes (Addendum)
COVID Vaccine Completed: yes  Date of COVID positive in last 45 days:no  [facility]P - [facility] [place]- Dr. Florestine Avers Cardiologist - n/a  Chest x-ray - 05/02/23 Epic EKG - 05/02/23 Epic Stress Test - yes years ago per pt 'heart was prefect' ECHO - n/a Cardiac Cath - n/a Pacemaker/ICD device last checked: n/a Spinal Cord Stimulator: n/a  Bowel Prep - no  Sleep Study - yes CPAP - yes every night   Fasting Blood Sugar - 110-120 Checks Blood Sugar  CGM. LUE  Last dose of GLP1 agonist-  N/A GLP1 instructions:  Hold 7 days before surgery. Last dose 08/09/23, do not take 08/16/23   Last dose of SGLT-2 inhibitors-  N/A SGLT-2 instructions:  Hold 3 days before surgery. Last dose 08/14/23   Blood Thinner Instructions:  n/a Aspirin Instructions: ASA  Last Dose:  Activity level: Can go up a flight of stairs and perform activities of daily living without stopping and without symptoms of chest pain or shortness of breath.   Anesthesia review:   Patient denies shortness of breath, fever, cough and chest pain at PAT appointment  Patient verbalized understanding of instructions that were given to them at the PAT appointment. Patient was also instructed that they will need to review over the PAT instructions again at home before surgery.

## 2023-08-08 ENCOUNTER — Other Ambulatory Visit: Payer: Self-pay

## 2023-08-08 ENCOUNTER — Encounter (HOSPITAL_COMMUNITY): Payer: Self-pay

## 2023-08-08 ENCOUNTER — Encounter (HOSPITAL_COMMUNITY)
Admission: RE | Admit: 2023-08-08 | Discharge: 2023-08-08 | Disposition: A | Discharge status: Home / Self Care | Payer: Federal, State, Local not specified - PPO | Source: Ambulatory Visit | Attending: Orthopedic Surgery | Admitting: Orthopedic Surgery

## 2023-08-08 VITALS — BP 125/90 | HR 76 | Temp 98.7°F | Resp 14 | Ht 72.0 in | Wt 276.0 lb

## 2023-08-08 DIAGNOSIS — Z01812 Encounter for preprocedural laboratory examination: Secondary | ICD-10-CM | POA: Diagnosis present

## 2023-08-08 DIAGNOSIS — E119 Type 2 diabetes mellitus without complications: Secondary | ICD-10-CM | POA: Diagnosis not present

## 2023-08-08 DIAGNOSIS — Z01818 Encounter for other preprocedural examination: Secondary | ICD-10-CM

## 2023-08-08 HISTORY — DX: Post-traumatic stress disorder, unspecified: F43.10

## 2023-08-08 LAB — CBC
HCT: 42.9 % (ref 39.0–52.0)
Hemoglobin: 13.7 g/dL (ref 13.0–17.0)
MCH: 31.7 pg (ref 26.0–34.0)
MCHC: 31.9 g/dL (ref 30.0–36.0)
MCV: 99.3 fL (ref 80.0–100.0)
Platelets: 288 10*3/uL (ref 150–400)
RBC: 4.32 MIL/uL (ref 4.22–5.81)
RDW: 14 % (ref 11.5–15.5)
WBC: 5.7 10*3/uL (ref 4.0–10.5)
nRBC: 0 % (ref 0.0–0.2)

## 2023-08-08 LAB — GLUCOSE, CAPILLARY: Glucose-Capillary: 144 mg/dL — ABNORMAL HIGH (ref 70–99)

## 2023-08-08 LAB — BASIC METABOLIC PANEL
Anion gap: 9 (ref 5–15)
BUN: 13 mg/dL (ref 6–20)
CO2: 28 mmol/L (ref 22–32)
Calcium: 9.2 mg/dL (ref 8.9–10.3)
Chloride: 102 mmol/L (ref 98–111)
Creatinine, Ser: 1.23 mg/dL (ref 0.61–1.24)
GFR, Estimated: >60 mL/min (ref >60)
Glucose, Bld: 117 mg/dL — ABNORMAL HIGH (ref 70–99)
Potassium: 4.6 mmol/L (ref 3.5–5.1)
Sodium: 139 mmol/L (ref 135–145)

## 2023-08-08 LAB — SURGICAL PCR SCREEN
MRSA, PCR: NEGATIVE
Staphylococcus aureus: POSITIVE — AB

## 2023-08-08 LAB — HEMOGLOBIN A1C
Hgb A1c MFr Bld: 5.3 % (ref 4.8–5.6)
Mean Plasma Glucose: 105.41 mg/dL

## 2023-08-08 NOTE — Patient Instructions (Signed)
SURGICAL WAITING ROOM VISITATION  Patients having surgery or a procedure may have no more than 2 support people in the waiting area - these visitors may rotate.    Children under the age of 38 must have an adult with them who is not the patient.  Due to an increase in RSV and influenza rates and associated hospitalizations, children ages 64 and under may not visit patients in [facility] hospitals.  If the patient needs to stay at the hospital during part of their recovery, the visitor guidelines for inpatient rooms apply. Pre-op nurse will coordinate an appropriate time for 1 support person to accompany patient in pre-op.  This support person may not rotate.    Please refer to the [facility] [place] website for the visitor guidelines for Inpatients (after your surgery is over and you are in a regular room).    Your procedure is scheduled on: 08/18/23   Report to [facility] Main Entrance    Report to admitting at 9:55 AM   Call this number if you have problems the morning of surgery [phone]   Do not eat food :After Midnight.   After Midnight you may have the following liquids until 9:25 AM DAY OF SURGERY  Water Non-Citrus Juices (without pulp, NO RED-Apple, White grape, White cranberry) Black Coffee (NO MILK/CREAM OR CREAMERS, sugar ok)  Clear Tea (NO MILK/CREAM OR CREAMERS, sugar ok) regular and decaf                             Plain Jell-O (NO RED)                                           Fruit ices (not with fruit pulp, NO RED)                                     Popsicles (NO RED)                                                               Sports drinks like Gatorade (NO RED)                 The day of surgery:  Drink ONE (1) Pre-Surgery G2 at 9:25 AM the morning of surgery. Drink in one sitting. Do not sip.  This drink was given to you during your hospital  pre-op appointment visit. Nothing else to drink after completing the  Pre-Surgery G2.          If you  have questions, please contact your surgeon's office.   FOLLOW BOWEL PREP AND ANY ADDITIONAL PRE OP INSTRUCTIONS YOU RECEIVED FROM YOUR SURGEON'S OFFICE!!!     Oral Hygiene is also important to reduce your risk of infection.                                    Remember - BRUSH YOUR TEETH THE MORNING OF SURGERY WITH YOUR REGULAR TOOTHPASTE  DENTURES WILL BE REMOVED  PRIOR TO SURGERY PLEASE DO NOT APPLY "Poly grip" OR ADHESIVES!!!   Do NOT smoke after Midnight   Stop all vitamins and herbal supplements 7 days before surgery.   Take these medicines the morning of surgery with A SIP OF WATER: Tylenol, Amlodipine, Allegra, Rosuvastatin, Sertraline  DO NOT TAKE ANY ORAL DIABETIC MEDICATIONS DAY OF YOUR SURGERY  How to Manage Your Diabetes Before and After Surgery  Why is it important to control my blood sugar before and after surgery? Improving blood sugar levels before and after surgery helps healing and can limit problems. A way of improving blood sugar control is eating a healthy diet by:  Eating less sugar and carbohydrates  Increasing activity/exercise  Talking with your doctor about reaching your blood sugar goals High blood sugars (greater than 180 mg/dL) can raise your risk of infections and slow your recovery, so you will need to focus on controlling your diabetes during the weeks before surgery. Make sure that the doctor who takes care of your diabetes knows about your planned surgery including the date and location.  How do I manage my blood sugar before surgery? Check your blood sugar at least 4 times a day, starting 2 days before surgery, to make sure that the level is not too high or low. Check your blood sugar the morning of your surgery when you wake up and every 2 hours until you get to the Short Stay unit. If your blood sugar is less than 70 mg/dL, you will need to treat for low blood sugar: Do not take insulin. Treat a low blood sugar (less than 70 mg/dL) with  cup of  clear juice (cranberry or apple), 4 glucose tablets, OR glucose gel. Recheck blood sugar in 15 minutes after treatment (to make sure it is greater than 70 mg/dL). If your blood sugar is not greater than 70 mg/dL on recheck, call [phone] for further instructions. Report your blood sugar to the short stay nurse when you get to Short Stay.  If you are admitted to the hospital after surgery: Your blood sugar will be checked by the staff and you will probably be given insulin after surgery (instead of oral diabetes medicines) to make sure you have good blood sugar levels. The goal for blood sugar control after surgery is 80-180 mg/dL.   WHAT DO I DO ABOUT MY DIABETES MEDICATION?  Do not take oral diabetes medicines (pills) the morning of surgery.  Hold Jardiance for 3 days prior. Last dose 08/14/23.  Do not take Ozempic for 7 days before surgery. Last dose 08/09/23.  DO NOT TAKE THE FOLLOWING 7 DAYS PRIOR TO SURGERY: Ozempic, Wegovy, Rybelsus (Semaglutide), Byetta (exenatide), Bydureon (exenatide ER), Victoza, Saxenda (liraglutide), or Trulicity (dulaglutide) Mounjaro (Tirzepatide) Adlyxin (Lixisenatide), Polyethylene Glycol Loxenatide.  Reviewed and Endorsed by [facility] Patient Education Committee, August 2015  Bring CPAP mask and tubing day of surgery.                              You may not have any metal on your body including jewelry, and body piercing             Do not wear lotions, powders, cologne, or deodorant              Men may shave face and neck.   Do not bring valuables to the hospital. [facility] IS NOT  RESPONSIBLE   FOR VALUABLES.   Contacts, glasses, dentures or bridgework may not be worn into surgery.  DO NOT BRING YOUR HOME MEDICATIONS TO THE HOSPITAL. PHARMACY WILL DISPENSE MEDICATIONS LISTED ON YOUR MEDICATION LIST TO YOU DURING YOUR ADMISSION IN THE HOSPITAL!    Patients discharged on the day of surgery will not be allowed to drive home.   Someone NEEDS to stay with you for the first 24 hours after anesthesia.   Special Instructions: Bring a copy of your healthcare power of attorney and living will documents the day of surgery if you haven't scanned them before.              Please read over the following fact sheets you were given: IF YOU HAVE QUESTIONS ABOUT YOUR PRE-OP INSTRUCTIONS PLEASE CALL [phone] Carson   If you received a COVID test during your pre-op visit  it is requested that you wear a mask when out in public, stay away from anyone that may not be feeling well and notify your surgeon if you develop symptoms. If you test positive for Covid or have been in contact with anyone that has tested positive in the last 10 days please notify you surgeon.      Pre-operative 5 CHG Bath Instructions   You can play a key role in reducing the risk of infection after surgery. Your skin needs to be as free of germs as possible. You can reduce the number of germs on your skin by washing with CHG (chlorhexidine gluconate) soap before surgery. CHG is an antiseptic soap that kills germs and continues to kill germs even after washing.   DO NOT use if you have an allergy to chlorhexidine/CHG or antibacterial soaps. If your skin becomes reddened or irritated, stop using the CHG and notify one of our RNs at [phone].   Please shower with the CHG soap starting 4 days before surgery using the following schedule:     Please keep in mind the following:  DO NOT shave, including legs and underarms, starting the day of your first shower.   You may shave your face at any point before/day of surgery.  Place clean sheets on your bed the day you start using CHG soap. Use a clean washcloth (not used since being washed) for each shower. DO NOT sleep with pets once you start using the CHG.   CHG Shower Instructions:  If you choose to wash your hair and private area, wash first with your normal shampoo/soap.  After you use shampoo/soap,  rinse your hair and body thoroughly to remove shampoo/soap residue.  Turn the water OFF and apply about 3 tablespoons (45 ml) of CHG soap to a CLEAN washcloth.  Apply CHG soap ONLY FROM YOUR NECK DOWN TO YOUR TOES (washing for 3-5 minutes)  DO NOT use CHG soap on face, private areas, open wounds, or sores.  Pay special attention to the area where your surgery is being performed.  If you are having back surgery, having someone wash your back for you may be helpful. Wait 2 minutes after CHG soap is applied, then you may rinse off the CHG soap.  Pat dry with a clean towel  Put on clean clothes/pajamas   If you choose to wear lotion, please use ONLY the CHG-compatible lotions on the back of this paper.     Additional instructions for the day of surgery: DO NOT APPLY any lotions, deodorants, cologne, or perfumes.   Put on clean/comfortable clothes.  Brush your  teeth.  Ask your nurse before applying any prescription medications to the skin.      CHG Compatible Lotions   Aveeno Moisturizing lotion  Cetaphil Moisturizing Cream  Cetaphil Moisturizing Lotion  Clairol Herbal Essence Moisturizing Lotion, Dry Skin  Clairol Herbal Essence Moisturizing Lotion, Extra Dry Skin  Clairol Herbal Essence Moisturizing Lotion, Normal Skin  Curel Age Defying Therapeutic Moisturizing Lotion with Alpha Hydroxy  Curel Extreme Care Body Lotion  Curel Soothing Hands Moisturizing Hand Lotion  Curel Therapeutic Moisturizing Cream, Fragrance-Free  Curel Therapeutic Moisturizing Lotion, Fragrance-Free  Curel Therapeutic Moisturizing Lotion, Original Formula  Eucerin Daily Replenishing Lotion  Eucerin Dry Skin Therapy Plus Alpha Hydroxy Crme  Eucerin Dry Skin Therapy Plus Alpha Hydroxy Lotion  Eucerin Original Crme  Eucerin Original Lotion  Eucerin Plus Crme Eucerin Plus Lotion  Eucerin TriLipid Replenishing Lotion  Keri Anti-Bacterial Hand Lotion  Keri Deep Conditioning Original Lotion Dry Skin Formula  Softly Scented  Keri Deep Conditioning Original Lotion, Fragrance Free Sensitive Skin Formula  Keri Lotion Fast Absorbing Fragrance Free Sensitive Skin Formula  Keri Lotion Fast Absorbing Softly Scented Dry Skin Formula  Keri Original Lotion  Keri Skin Renewal Lotion Keri Silky Smooth Lotion  Keri Silky Smooth Sensitive Skin Lotion  Nivea Body Creamy Conditioning Oil  Nivea Body Extra Enriched Lotion  Nivea Body Original Lotion  Nivea Body Sheer Moisturizing Lotion Nivea Crme  Nivea Skin Firming Lotion  NutraDerm 30 Skin Lotion  NutraDerm Skin Lotion  NutraDerm Therapeutic Skin Cream  NutraDerm Therapeutic Skin Lotion  ProShield Protective Hand Cream  Provon moisturizing lotion   Incentive Spirometer  An incentive spirometer is a tool that can help keep your lungs clear and active. This tool measures how well you are filling your lungs with each breath. Taking long deep breaths may help reverse or decrease the chance of developing breathing (pulmonary) problems (especially infection) following: A long period of time when you are unable to move or be active. BEFORE THE PROCEDURE  If the spirometer includes an indicator to show your best effort, your nurse or respiratory therapist will set it to a desired goal. If possible, sit up straight or lean slightly forward. Try not to slouch. Hold the incentive spirometer in an upright position. INSTRUCTIONS FOR USE  Sit on the edge of your bed if possible, or sit up as far as you can in bed or on a chair. Hold the incentive spirometer in an upright position. Breathe out normally. Place the mouthpiece in your mouth and seal your lips tightly around it. Breathe in slowly and as deeply as possible, raising the piston or the ball toward the top of the column. Hold your breath for 3-5 seconds or for as long as possible. Allow the piston or ball to fall to the bottom of the column. Remove the mouthpiece from your mouth and breathe out  normally. Rest for a few seconds and repeat Steps 1 through 7 at least 10 times every 1-2 hours when you are awake. Take your time and take a few normal breaths between deep breaths. The spirometer may include an indicator to show your best effort. Use the indicator as a goal to work toward during each repetition. After each set of 10 deep breaths, practice coughing to be sure your lungs are clear. If you have an incision (the cut made at the time of surgery), support your incision when coughing by placing a pillow or rolled up towels firmly against it. Once you are able to get out  of bed, walk around indoors and cough well. You may stop using the incentive spirometer when instructed by your caregiver.  RISKS AND COMPLICATIONS Take your time so you do not get dizzy or light-headed. If you are in pain, you may need to take or ask for pain medication before doing incentive spirometry. It is harder to take a deep breath if you are having pain. AFTER USE Rest and breathe slowly and easily. It can be helpful to keep track of a log of your progress. Your caregiver can provide you with a simple table to help with this. If you are using the spirometer at home, follow these instructions: SEEK MEDICAL CARE IF:  You are having difficultly using the spirometer. You have trouble using the spirometer as often as instructed. Your pain medication is not giving enough relief while using the spirometer. You develop fever of 100.5 F (38.1 C) or higher. SEEK IMMEDIATE MEDICAL CARE IF:  You cough up bloody sputum that had not been present before. You develop fever of 102 F (38.9 C) or greater. You develop worsening pain at or near the incision site. MAKE SURE YOU:  Understand these instructions. Will watch your condition. Will get help right away if you are not doing well or get worse. Document Released: 01/27/2007 Document Revised: 12/09/2011 Document Reviewed: 03/30/2007 [employer] Patient Information  2014 [place], [place].   ________________________________________________________________________

## 2023-08-08 NOTE — Progress Notes (Signed)
[facility] results routed to Dr. Rowan 

## 2023-08-14 DIAGNOSIS — M1712 Unilateral primary osteoarthritis, left knee: Secondary | ICD-10-CM | POA: Diagnosis present

## 2023-08-14 NOTE — H&P (Signed)
TOTAL KNEE ADMISSION H&P  Patient is being admitted for left total knee arthroplasty.  Subjective:  Chief Complaint:left knee pain.  HPI: Nathan Carson, 57 y.o. male, has a history of pain and functional disability in the left knee due to arthritis and has failed non-surgical conservative treatments for greater than 12 weeks to includeNSAID's and/or analgesics, corticosteriod injections, viscosupplementation injections, flexibility and strengthening excercises, supervised PT with diminished ADL's post treatment, use of assistive devices, weight reduction as appropriate, and activity modification.  Onset of symptoms was abrupt, starting 2 years ago with rapidlly worsening course since that time. The patient noted no past surgery on the left knee(s).  Patient currently rates pain in the left knee(s) at 10 out of 10 with activity. Patient has night pain, worsening of pain with activity and weight bearing, pain that interferes with activities of daily living, pain with passive range of motion, crepitus, and joint swelling.  Patient has evidence of subchondral sclerosis, periarticular osteophytes, and joint space narrowing by imaging studies. There is no active infection.  Patient Active Problem List   Diagnosis Date Noted   Degenerative arthritis of left knee 08/14/2023   Osteoarthritis of right knee 05/07/2023   Past Medical History:  Diagnosis Date   Arthritis    knees, right shoulder   CKD (chronic kidney disease)    Diabetes mellitus without complication (HCC)    Family history of adverse reaction to anesthesia    pt's mother has hx. of post-op N/V   GERD (gastroesophageal reflux disease)    Hypertension    Migraines    PTSD (post-traumatic stress disorder)    Rotator cuff tear 11/29/2014   right - recurrent   Sinus headache    Sleep apnea     Past Surgical History:  Procedure Laterality Date   ACHILLES TENDON SURGERY Right 10/06/2013   Procedure: RIGHT ACHILLES TENDON REPAIR;   Surgeon: Nestor Lewandowsky, MD;  Location: [facility];  Service: Orthopedics;  Laterality: Right;   KNEE ARTHROSCOPY Left    SHOULDER ARTHROSCOPY Right    SHOULDER ARTHROSCOPY Left    SHOULDER ARTHROSCOPY WITH BICEPSTENOTOMY Right 12/05/2014   Procedure: SHOULDER ARTHROSCOPY WITH BICEPSTENOTOMY;  Surgeon: Jones Broom, MD;  Location: [facility];  Service: Orthopedics;  Laterality: Right;   SHOULDER ARTHROSCOPY WITH ROTATOR CUFF REPAIR Right 12/05/2014   Procedure: RIGHT SHOULDER ARTHROSCOPY REVISION WITH ROTATOR CUFF REPAIR ;  Surgeon: Jones Broom, MD;  Location: [facility];  Service: Orthopedics;  Laterality: Right;  Right shoulder arthroscopy revision arthrscopy rotator cuff repair    TOTAL KNEE ARTHROPLASTY Right 05/12/2023   Procedure: RIGHT TOTAL KNEE ARTHROPLASTY;  Surgeon: Gean Birchwood, MD;  Location: [facility];  Service: Orthopedics;  Laterality: Right;   WISDOM TOOTH EXTRACTION      No current facility-administered medications for this encounter.   Current Outpatient Medications  Medication Sig Dispense Refill Last Dose   acetaminophen (TYLENOL) 500 MG tablet Take 1,000 mg by mouth every 6 (six) hours as needed for moderate pain (pain score 4-6).      amLODipine (NORVASC) 5 MG tablet Take 10 mg by mouth daily.      cyclobenzaprine (FLEXERIL) 10 MG tablet Take 10 mg by mouth 2 (two) times daily as needed for muscle spasms.      empagliflozin (JARDIANCE) 25 MG TABS tablet Take 12.5 mg by mouth daily.      fexofenadine (ALLEGRA) 180 MG tablet Take 180 mg by mouth daily.      hydrOXYzine (  VISTARIL) 50 MG capsule Take 50 mg by mouth at bedtime as needed (sleep).      losartan (COZAAR) 50 MG tablet Take 50 mg by mouth daily.      melatonin 3 MG TABS tablet Take 9 mg by mouth at bedtime.      NON FORMULARY Pt uses a c-pap nightly      rosuvastatin (CRESTOR) 10 MG tablet Take 10 mg by mouth every other day.      Semaglutide,0.25 or 0.5MG /DOS, 2  MG/3ML SOPN Inject 0.5 mg into the skin every Saturday.      sertraline (ZOLOFT) 100 MG tablet Take 100 mg by mouth daily.      SUMAtriptan (IMITREX) 6 MG/0.5ML SOSY injection Inject 6 mg into the skin every 2 (two) hours as needed for migraine.      tiZANidine (ZANAFLEX) 2 MG tablet Take 1 tablet (2 mg total) by mouth every 8 (eight) hours as needed for muscle spasms. 30 tablet 0    traZODone (DESYREL) 100 MG tablet Take 200 mg by mouth at bedtime.      aspirin EC 325 MG tablet Take 1 tablet (325 mg total) by mouth 2 (two) times daily. (Patient not taking: Reported on 08/05/2023) 30 tablet 0 Not Taking   docusate sodium (COLACE) 100 MG capsule Take 1 capsule (100 mg total) by mouth 3 (three) times daily as needed. (Patient not taking: Reported on 05/01/2023) 20 capsule 0    Allergies  Allergen Reactions   Metformin And Related Nausea Only   Peanut-Containing Drug Products Swelling    SWELLING FACE AND TONGUE   Sulfa Antibiotics Hives   Topiramate Nausea Only    Social History   Tobacco Use   Smoking status: Never   Smokeless tobacco: Never  Substance Use Topics   Alcohol use: Yes    Comment: occasionally    Family History  Problem Relation Age of Onset   Anesthesia problems Mother        post-op N/V     Review of Systems  Constitutional: Negative.   HENT:  Positive for sinus pressure.   Eyes: Negative.   Respiratory: Negative.    Cardiovascular:        Htn  Gastrointestinal: Negative.   Endocrine: Negative.   Genitourinary: Negative.   Musculoskeletal:  Positive for arthralgias and myalgias.  Skin: Negative.   Allergic/Immunologic: Negative.   Neurological:  Positive for headaches.  Hematological: Negative.   Psychiatric/Behavioral:  Positive for sleep disturbance.     Objective:  Physical Exam Constitutional:      Appearance: Normal appearance. He is normal weight.  HENT:     Head: Normocephalic and atraumatic.     Nose: Nose normal.  Eyes:     Pupils: Pupils  are equal, round, and reactive to light.  Cardiovascular:     Pulses: Normal pulses.  Pulmonary:     Effort: Pulmonary effort is normal.  Musculoskeletal:        General: Tenderness present.     Cervical back: Normal range of motion and neck supple.     Comments: Patient's left knee has a range from 0-120.  Tenderness over the medial joint line.  Mild effusion.  No erythema or warmth.  Calves are soft and nontender.  Skin:    General: Skin is warm and dry.  Neurological:     General: No focal deficit present.     Mental Status: He is alert and oriented to person, place, and time. Mental status is at  baseline.  Psychiatric:        Mood and Affect: Mood normal.        Behavior: Behavior normal.        Thought Content: Thought content normal.        Judgment: Judgment normal.     Vital signs in last 24 hours:    Labs:   Estimated body mass index is 37.43 kg/m as calculated from the following:   Height as of 08/08/23: 6' (1.829 m).   Weight as of 08/08/23: 125.2 kg.   Imaging Review Plain radiographs demonstrate bilateral AP weightbearing, lateral and sunrise views of the right knee are taken and reviewed in office today.  Patient's left knee has near bone-on-bone arthritis medial compartment.  Patient's right knee shows a well-placed well fixed right total knee arthroplasty.      Assessment/Plan:  End stage arthritis, left knee   The patient history, physical examination, clinical judgment of the provider and imaging studies are consistent with end stage degenerative joint disease of the left knee(s) and total knee arthroplasty is deemed medically necessary. The treatment options including medical management, injection therapy arthroscopy and arthroplasty were discussed at length. The risks and benefits of total knee arthroplasty were presented and reviewed. The risks due to aseptic loosening, infection, stiffness, patella tracking problems, thromboembolic complications and  other imponderables were discussed. The patient acknowledged the explanation, agreed to proceed with the plan and consent was signed. Patient is being admitted for inpatient treatment for surgery, pain control, PT, OT, prophylactic antibiotics, VTE prophylaxis, progressive ambulation and ADL's and discharge planning. The patient is planning to be discharged home with home health services     Patient's anticipated LOS is less than 2 midnights, meeting these requirements: - Younger than 31 - Lives within 1 hour of care - Has a competent adult at home to recover with post-op recover - NO history of  - Chronic pain requiring opiods  - Diabetes  - Coronary Artery Disease  - Heart failure  - Heart attack  - Stroke  - DVT/VTE  - Cardiac arrhythmia  - Respiratory Failure/COPD  - Renal failure  - Anemia  - Advanced Liver disease

## 2023-08-18 ENCOUNTER — Other Ambulatory Visit: Payer: Self-pay

## 2023-08-18 ENCOUNTER — Encounter (HOSPITAL_COMMUNITY): Payer: Self-pay | Admitting: Orthopedic Surgery

## 2023-08-18 ENCOUNTER — Ambulatory Visit (HOSPITAL_COMMUNITY)
Admission: RE | Admit: 2023-08-18 | Discharge: 2023-08-18 | Disposition: A | Discharge status: Home / Self Care | Payer: Federal, State, Local not specified - PPO | Source: Ambulatory Visit | Attending: Orthopedic Surgery | Admitting: Orthopedic Surgery

## 2023-08-18 ENCOUNTER — Encounter (HOSPITAL_COMMUNITY)
Admission: RE | Disposition: A | Discharge status: Home / Self Care | Payer: Self-pay | Source: Ambulatory Visit | Attending: Orthopedic Surgery

## 2023-08-18 ENCOUNTER — Other Ambulatory Visit (HOSPITAL_COMMUNITY): Payer: Self-pay

## 2023-08-18 ENCOUNTER — Ambulatory Visit (HOSPITAL_COMMUNITY): Payer: Federal, State, Local not specified - PPO | Admitting: Anesthesiology

## 2023-08-18 ENCOUNTER — Ambulatory Visit (HOSPITAL_BASED_OUTPATIENT_CLINIC_OR_DEPARTMENT_OTHER): Payer: Federal, State, Local not specified - PPO | Admitting: Anesthesiology

## 2023-08-18 DIAGNOSIS — Z7984 Long term (current) use of oral hypoglycemic drugs: Secondary | ICD-10-CM | POA: Diagnosis not present

## 2023-08-18 DIAGNOSIS — E119 Type 2 diabetes mellitus without complications: Secondary | ICD-10-CM

## 2023-08-18 DIAGNOSIS — M1712 Unilateral primary osteoarthritis, left knee: Secondary | ICD-10-CM | POA: Diagnosis present

## 2023-08-18 DIAGNOSIS — I129 Hypertensive chronic kidney disease with stage 1 through stage 4 chronic kidney disease, or unspecified chronic kidney disease: Secondary | ICD-10-CM | POA: Insufficient documentation

## 2023-08-18 DIAGNOSIS — Z96651 Presence of right artificial knee joint: Secondary | ICD-10-CM | POA: Diagnosis not present

## 2023-08-18 DIAGNOSIS — Z87891 Personal history of nicotine dependence: Secondary | ICD-10-CM | POA: Insufficient documentation

## 2023-08-18 DIAGNOSIS — N189 Chronic kidney disease, unspecified: Secondary | ICD-10-CM | POA: Diagnosis not present

## 2023-08-18 DIAGNOSIS — G4730 Sleep apnea, unspecified: Secondary | ICD-10-CM | POA: Diagnosis not present

## 2023-08-18 DIAGNOSIS — K219 Gastro-esophageal reflux disease without esophagitis: Secondary | ICD-10-CM | POA: Insufficient documentation

## 2023-08-18 DIAGNOSIS — M170 Bilateral primary osteoarthritis of knee: Secondary | ICD-10-CM | POA: Diagnosis present

## 2023-08-18 DIAGNOSIS — Z96652 Presence of left artificial knee joint: Principal | ICD-10-CM

## 2023-08-18 DIAGNOSIS — Z7985 Long-term (current) use of injectable non-insulin antidiabetic drugs: Secondary | ICD-10-CM | POA: Insufficient documentation

## 2023-08-18 DIAGNOSIS — E1122 Type 2 diabetes mellitus with diabetic chronic kidney disease: Secondary | ICD-10-CM | POA: Insufficient documentation

## 2023-08-18 HISTORY — PX: TOTAL KNEE ARTHROPLASTY: SHX125

## 2023-08-18 LAB — GLUCOSE, CAPILLARY
Glucose-Capillary: 102 mg/dL — ABNORMAL HIGH (ref 70–99)
Glucose-Capillary: 102 mg/dL — ABNORMAL HIGH (ref 70–99)

## 2023-08-18 SURGERY — ARTHROPLASTY, KNEE, TOTAL
Anesthesia: General | Site: Knee | Laterality: Left

## 2023-08-18 MED ORDER — MIDAZOLAM HCL 2 MG/2ML IJ SOLN
2.0000 mg | Freq: Once | INTRAMUSCULAR | Status: AC
  Administered 2023-08-18: 2 mg via INTRAVENOUS
  Filled 2023-08-18: qty 2

## 2023-08-18 MED ORDER — DROPERIDOL 2.5 MG/ML IJ SOLN: 0.6250 mg | Freq: Once | INTRAMUSCULAR | Status: DC | PRN

## 2023-08-18 MED ORDER — LACTATED RINGERS IV SOLN: INTRAVENOUS | Status: AC

## 2023-08-18 MED ORDER — POVIDONE-IODINE 10 % EX SWAB: 2.0000 | Freq: Once | CUTANEOUS | Status: DC

## 2023-08-18 MED ORDER — PHENYLEPHRINE HCL-NACL 20-0.9 MG/250ML-% IV SOLN
INTRAVENOUS | Status: DC | PRN
  Administered 2023-08-18: 50 ug/min via INTRAVENOUS

## 2023-08-18 MED ORDER — DEXMEDETOMIDINE HCL IN NACL 80 MCG/20ML IV SOLN
INTRAVENOUS | Status: DC | PRN
  Administered 2023-08-18: 12 ug via INTRAVENOUS

## 2023-08-18 MED ORDER — PROPOFOL 1000 MG/100ML IV EMUL
INTRAVENOUS | Status: AC
  Filled 2023-08-18: qty 100

## 2023-08-18 MED ORDER — TRANEXAMIC ACID 1000 MG/10ML IV SOLN
INTRAVENOUS | Status: DC | PRN
  Administered 2023-08-18: 2000 mg via TOPICAL

## 2023-08-18 MED ORDER — ASPIRIN 81 MG PO TBEC
81.0000 mg | DELAYED_RELEASE_TABLET | Freq: Two times a day (BID) | ORAL | 0 refills | Status: DC
  Filled 2023-08-18: qty 60, 30d supply, fill #0

## 2023-08-18 MED ORDER — CEFAZOLIN IN SODIUM CHLORIDE 3-0.9 GM/100ML-% IV SOLN
3.0000 g | INTRAVENOUS | Status: AC
  Administered 2023-08-18: 3 g via INTRAVENOUS
  Filled 2023-08-18: qty 100

## 2023-08-18 MED ORDER — ACETAMINOPHEN 160 MG/5ML PO SOLN: 325.0000 mg | Freq: Once | ORAL | Status: DC | PRN

## 2023-08-18 MED ORDER — ONDANSETRON HCL 4 MG/2ML IJ SOLN
INTRAMUSCULAR | Status: AC
  Filled 2023-08-18: qty 2

## 2023-08-18 MED ORDER — DEXAMETHASONE SODIUM PHOSPHATE 10 MG/ML IJ SOLN
INTRAMUSCULAR | Status: AC
  Filled 2023-08-18: qty 1

## 2023-08-18 MED ORDER — TIZANIDINE HCL 2 MG PO TABS
2.0000 mg | ORAL_TABLET | Freq: Four times a day (QID) | ORAL | 0 refills | Status: DC | PRN
  Filled 2023-08-18: qty 46, 12d supply, fill #0

## 2023-08-18 MED ORDER — GLYCOPYRROLATE 0.2 MG/ML IJ SOLN
INTRAMUSCULAR | Status: AC
  Filled 2023-08-18: qty 1

## 2023-08-18 MED ORDER — ONDANSETRON HCL 4 MG/2ML IJ SOLN
INTRAMUSCULAR | Status: DC | PRN
  Administered 2023-08-18: 4 mg via INTRAVENOUS

## 2023-08-18 MED ORDER — MIDAZOLAM HCL 2 MG/2ML IJ SOLN
INTRAMUSCULAR | Status: DC | PRN
  Administered 2023-08-18: 2 mg via INTRAVENOUS

## 2023-08-18 MED ORDER — TRANEXAMIC ACID 1000 MG/10ML IV SOLN
2000.0000 mg | INTRAVENOUS | Status: DC
  Filled 2023-08-18: qty 20

## 2023-08-18 MED ORDER — FENTANYL CITRATE PF 50 MCG/ML IJ SOSY
100.0000 ug | PREFILLED_SYRINGE | Freq: Once | INTRAMUSCULAR | Status: AC
  Administered 2023-08-18: 50 ug via INTRAVENOUS
  Filled 2023-08-18: qty 2

## 2023-08-18 MED ORDER — 0.9 % SODIUM CHLORIDE (POUR BTL) OPTIME
TOPICAL | Status: DC | PRN
  Administered 2023-08-18: 1000 mL

## 2023-08-18 MED ORDER — BUPIVACAINE-EPINEPHRINE 0.25% -1:200000 IJ SOLN
INTRAMUSCULAR | Status: AC
  Filled 2023-08-18: qty 1

## 2023-08-18 MED ORDER — ACETAMINOPHEN 325 MG PO TABS: 325.0000 mg | ORAL_TABLET | Freq: Once | ORAL | Status: DC | PRN

## 2023-08-18 MED ORDER — OXYCODONE-ACETAMINOPHEN 5-325 MG PO TABS
1.0000 | ORAL_TABLET | ORAL | 0 refills | Status: DC | PRN
  Filled 2023-08-18: qty 30, 5d supply, fill #0

## 2023-08-18 MED ORDER — SODIUM CHLORIDE 0.9 % IV SOLN
INTRAVENOUS | Status: DC | PRN
  Administered 2023-08-18: 1000 mL

## 2023-08-18 MED ORDER — CHLORHEXIDINE GLUCONATE 0.12 % MT SOLN
15.0000 mL | Freq: Once | OROMUCOSAL | Status: AC
  Administered 2023-08-18: 15 mL via OROMUCOSAL

## 2023-08-18 MED ORDER — BUPIVACAINE LIPOSOME 1.3 % IJ SUSP
INTRAMUSCULAR | Status: AC
  Filled 2023-08-18: qty 20

## 2023-08-18 MED ORDER — MEPERIDINE HCL 50 MG/ML IJ SOLN: 6.2500 mg | INTRAMUSCULAR | Status: DC | PRN

## 2023-08-18 MED ORDER — BUPIVACAINE LIPOSOME 1.3 % IJ SUSP: 20.0000 mL | Freq: Once | INTRAMUSCULAR | Status: DC

## 2023-08-18 MED ORDER — BUPIVACAINE IN DEXTROSE 0.75-8.25 % IT SOLN
INTRATHECAL | Status: DC | PRN
  Administered 2023-08-18: 2 mL via INTRATHECAL

## 2023-08-18 MED ORDER — BUPIVACAINE-EPINEPHRINE (PF) 0.25% -1:200000 IJ SOLN
INTRAMUSCULAR | Status: DC | PRN
  Administered 2023-08-18: 30 mL

## 2023-08-18 MED ORDER — WATER FOR IRRIGATION, STERILE IR SOLN
Status: DC | PRN
  Administered 2023-08-18: 1000 mL

## 2023-08-18 MED ORDER — DEXAMETHASONE SODIUM PHOSPHATE 10 MG/ML IJ SOLN
INTRAMUSCULAR | Status: DC | PRN
  Administered 2023-08-18: 10 mg via INTRAVENOUS

## 2023-08-18 MED ORDER — BUPIVACAINE LIPOSOME 1.3 % IJ SUSP
INTRAMUSCULAR | Status: DC | PRN
  Administered 2023-08-18: 20 mL

## 2023-08-18 MED ORDER — TRANEXAMIC ACID-NACL 1000-0.7 MG/100ML-% IV SOLN
1000.0000 mg | INTRAVENOUS | Status: AC
  Administered 2023-08-18: 1000 mg via INTRAVENOUS
  Filled 2023-08-18: qty 100

## 2023-08-18 MED ORDER — OXYCODONE-ACETAMINOPHEN 5-325 MG PO TABS: 1.0000 | ORAL_TABLET | ORAL | 0 refills | Status: AC | PRN

## 2023-08-18 MED ORDER — SODIUM CHLORIDE (PF) 0.9 % IJ SOLN
INTRAMUSCULAR | Status: DC | PRN
  Administered 2023-08-18: 70 mL via INTRAVENOUS

## 2023-08-18 MED ORDER — MIDAZOLAM HCL 2 MG/2ML IJ SOLN
INTRAMUSCULAR | Status: AC
Start: 2023-08-18 — End: ?
  Filled 2023-08-18: qty 2

## 2023-08-18 MED ORDER — ORAL CARE MOUTH RINSE: 15.0000 mL | Freq: Once | OROMUCOSAL | Status: AC

## 2023-08-18 MED ORDER — HYDROMORPHONE HCL 1 MG/ML IJ SOLN: 0.2500 mg | INTRAMUSCULAR | Status: DC | PRN

## 2023-08-18 MED ORDER — PROPOFOL 500 MG/50ML IV EMUL
INTRAVENOUS | Status: DC | PRN
  Administered 2023-08-18: 100 ug/kg/min via INTRAVENOUS

## 2023-08-18 MED ORDER — LACTATED RINGERS IV SOLN: INTRAVENOUS | Status: DC

## 2023-08-18 MED ORDER — SODIUM CHLORIDE (PF) 0.9 % IJ SOLN
INTRAMUSCULAR | Status: AC
  Filled 2023-08-18: qty 20

## 2023-08-18 MED ORDER — ACETAMINOPHEN 10 MG/ML IV SOLN: 1000.0000 mg | Freq: Once | INTRAVENOUS | Status: DC | PRN

## 2023-08-18 MED ORDER — ROPIVACAINE HCL 5 MG/ML IJ SOLN
INTRAMUSCULAR | Status: DC | PRN
  Administered 2023-08-18: 30 mL via PERINEURAL

## 2023-08-18 MED ORDER — GLYCOPYRROLATE 0.2 MG/ML IJ SOLN
INTRAMUSCULAR | Status: DC | PRN
  Administered 2023-08-18: .2 mg via INTRAVENOUS

## 2023-08-18 MED ORDER — SODIUM CHLORIDE (PF) 0.9 % IJ SOLN
INTRAMUSCULAR | Status: AC
  Filled 2023-08-18: qty 50

## 2023-08-18 MED ORDER — TIZANIDINE HCL 2 MG PO TABS: 2.0000 mg | ORAL_TABLET | Freq: Four times a day (QID) | ORAL | 0 refills | Status: AC | PRN

## 2023-08-18 MED ORDER — ASPIRIN 81 MG PO TBEC: 81.0000 mg | DELAYED_RELEASE_TABLET | Freq: Two times a day (BID) | ORAL | 0 refills | Status: AC

## 2023-08-18 SURGICAL SUPPLY — 41 items
ATTUNE MED DOME PAT 41 KNEE (Knees) IMPLANT
ATTUNE PS FEM LT SZ 8 CEM KNEE (Femur) IMPLANT
ATTUNE PSRP INSR SZ8 6 KNEE (Insert) IMPLANT
BAG COUNTER SPONGE SURGICOUNT (BAG) IMPLANT
BAG DECANTER FOR FLEXI CONT (MISCELLANEOUS) ×1 IMPLANT
BAG ZIPLOCK 12X15 (MISCELLANEOUS) ×1 IMPLANT
BASE TIBIAL ROT PLAT SZ 10 KNE (Miscellaneous) IMPLANT
BLADE SAG 18X100X1.27 (BLADE) ×1 IMPLANT
BLADE SAW SGTL 11.0X1.19X90.0M (BLADE) ×1 IMPLANT
BNDG ELASTIC 6X10 VLCR STRL LF (GAUZE/BANDAGES/DRESSINGS) ×1 IMPLANT
BOWL SMART MIX CTS (DISPOSABLE) ×1 IMPLANT
CEMENT HV SMART SET (Cement) ×2 IMPLANT
COVER SURGICAL LIGHT HANDLE (MISCELLANEOUS) ×1 IMPLANT
DRAPE INCISE IOBAN 66X45 STRL (DRAPES) IMPLANT
DRAPE U-SHAPE 47X51 STRL (DRAPES) ×1 IMPLANT
DRSG AQUACEL AG ADV 3.5X10 (GAUZE/BANDAGES/DRESSINGS) ×1 IMPLANT
DURAPREP 26ML APPLICATOR (WOUND CARE) ×1 IMPLANT
ELECT REM PT RETURN 15FT ADLT (MISCELLANEOUS) ×1 IMPLANT
GLOVE BIO SURGEON STRL SZ7.5 (GLOVE) ×1 IMPLANT
GLOVE BIO SURGEON STRL SZ8.5 (GLOVE) ×1 IMPLANT
GLOVE BIOGEL PI IND STRL 8 (GLOVE) ×1 IMPLANT
GLOVE BIOGEL PI IND STRL 9 (GLOVE) ×1 IMPLANT
GOWN STRL REUS W/ TWL XL LVL3 (GOWN DISPOSABLE) ×2 IMPLANT
GOWN STRL REUS W/TWL XL LVL3 (GOWN DISPOSABLE) ×2
HANDPIECE INTERPULSE COAX TIP (DISPOSABLE) ×1
HOOD PEEL AWAY T7 (MISCELLANEOUS) ×3 IMPLANT
KIT TURNOVER KIT A (KITS) IMPLANT
NS IRRIG 1000ML POUR BTL (IV SOLUTION) ×1 IMPLANT
PACK TOTAL KNEE CUSTOM (KITS) ×1 IMPLANT
PIN STEINMAN FIXATION KNEE (PIN) IMPLANT
PROTECTOR NERVE ULNAR (MISCELLANEOUS) ×1 IMPLANT
SET HNDPC FAN SPRY TIP SCT (DISPOSABLE) ×1 IMPLANT
SUT VIC AB 1 CTX 36 (SUTURE) ×1
SUT VIC AB 1 CTX36XBRD ANBCTR (SUTURE) ×1 IMPLANT
SUT VICRYL+ 3-0 36IN CT-1 (SUTURE) ×3 IMPLANT
SYR CONTROL 10ML LL (SYRINGE) ×2 IMPLANT
TIBIAL BASE ROT PLAT SZ 10 KNE (Miscellaneous) ×1 IMPLANT
TRAY CATH INTERMITTENT SS 16FR (CATHETERS) ×1 IMPLANT
TUBE SUCTION HIGH CAP CLEAR NV (SUCTIONS) ×1 IMPLANT
WATER STERILE IRR 1000ML POUR (IV SOLUTION) ×2 IMPLANT
WRAP KNEE MAXI GEL POST OP (GAUZE/BANDAGES/DRESSINGS) ×1 IMPLANT

## 2023-08-18 NOTE — Progress Notes (Signed)
PA contacted because patient was under the impression that he was going home.  PA ok with patient going as long as spinal wears off.  Patient voices desire to go home today.  Communicated this with patient and PA.

## 2023-08-18 NOTE — Anesthesia Procedure Notes (Signed)
Anesthesia Regional Block: Adductor canal block   Pre-Anesthetic Checklist: , timeout performed,  Correct Patient, Correct Site, Correct Laterality,  Correct Procedure, Correct Position, site marked,  Risks and benefits discussed,  Surgical consent,  Pre-op evaluation,  At surgeon's request and post-op pain management  Laterality: Left  Prep: chloraprep       Needles:  Injection technique: Single-shot  Needle Type: Echogenic Stimulator Needle     Needle Length: 9cm  Needle Gauge: 21     Additional Needles:   Procedures:,,,, ultrasound used (permanent image in chart),,    Narrative:  Start time: 08/18/2023 11:31 AM End time: 08/18/2023 11:36 AM Injection made incrementally with aspirations every 5 mL.  Performed by: Personally  Anesthesiologist: Shelton Silvas, MD  Additional Notes: Discussed risks and benefits of the nerve block in detail, including but not limited vascular injury, permanent nerve damage and infection.   Patient tolerated the procedure well. Local anesthetic introduced in an incremental fashion under minimal resistance after negative aspirations. No paresthesias were elicited. After completion of the procedure, no acute issues were identified and patient continued to be monitored by RN.

## 2023-08-18 NOTE — Op Note (Signed)
PATIENT ID:      Nathan Carson  MRN:     [id number] DOB/AGE:    Jul 20, 1966 / 57 y.o.       OPERATIVE REPORT   DATE OF PROCEDURE:  08/18/2023      PREOPERATIVE DIAGNOSIS:   LEFT KNEE OSTEOARTHRITIS      Estimated body mass index is 37.43 kg/m as calculated from the following:   Height as of this encounter: 6' (1.829 m).   Weight as of this encounter: 125.2 kg.                                                       POSTOPERATIVE DIAGNOSIS:   Same                                                                  PROCEDURE:  Procedure(s): LEFT TOTAL KNEE ARTHROPLASTY Using DepuyAttune RP implants #8L Femur, #10Tibia, 6 mm Attune RP bearing, 41 Patella    SURGEON: Nestor Lewandowsky  ASSISTANT:   Tomi Likens. [employer]   (Present and scrubbed throughout the case, critical for assistance with exposure, retraction, instrumentation, and closure.)        ANESTHESIA: Spinal, 20cc Exparel, 50cc 0.25% Marcaine EBL: 300 cc FLUID REPLACEMENT: 1600 cc crystaloid TOURNIQUET: DRAINS: None TRANEXAMIC ACID: 1gm IV, 2gm topical COMPLICATIONS:  None         INDICATIONS FOR PROCEDURE: The patient has  LEFT KNEE OSTEOARTHRITIS, Var deformities, XR shows bone on bone arthritis, lateral subluxation of tibia. Patient has failed all conservative measures including anti-inflammatory medicines, narcotics, attempts at exercise and weight loss, cortisone injections and viscosupplementation.  Risks and benefits of surgery have been discussed, questions answered.   DESCRIPTION OF PROCEDURE: The patient identified by armband, received  IV antibiotics, in the holding area at [facility]. Patient taken to the operating room, appropriate anesthetic monitors were attached, and Spinal anesthesia was  induced. IV Tranexamic acid was given. Lateral post and 2 surefoot positioners applied to the table, the lower extremity was then prepped and draped in usual sterile fashion from the toes to the high thigh. Time-out  procedure was performed. Tomi Likens. [facility] PAC, was present and scrubbed throughout the case, critical for assistance with, positioning, exposure, retraction, instrumentation, and closure.The skin and subcutaneous tissue along the incision was injected with 20 cc of a mixture of 20cc Exparel and 30cc Marcaine 50cc saline solution, using a 21-gauge by 1-1/2 inch needle. We began the operation, with the knee flexed 130 degrees, by making the anterior midline incision starting at handbreadth above the patella going over the patella 1 cm medial to and 4 cm distal to the tibial tubercle. Small bleeders in the skin and the subcutaneous tissue identified and cauterized. Transverse retinaculum was incised and reflected medially and a medial parapatellar arthrotomy was accomplished. the patella was everted and theprepatellar fat pad resected. The superficial medial collateral ligament was then elevated from anterior to posterior along the proximal flare of the tibia and anterior half of the menisci resected. The knee was hyperflexed exposing bone on bone arthritis. Peripheral and notch  osteophytes as well as the cruciate ligaments were then resected. We continued to work our way around posteriorly along the proximal tibia, and externally rotated the tibia subluxing it out from underneath the femur. A McHale PCL retractor was placed through the notch, a lateral Hohmann retractor, and anterolateral small homan retractor placed. We then entered the proximal tibia with the Depuy starter drill in line with the axis of the tibia followed by an intramedullary guide rod and 3-degree posterior slope cutting guide. The tibial cutting guide, was pinned into place allowing resection of 3 mm of bone medially and 10 mm of bone laterally. Satisfied with the tibial resection, we then entered the distal femur 2 mm anterior to the PCL origin with the starter drill, followed by the intramedullary guide rod and applied the distal femoral cutting  guide set at 9 mm, with 5 degrees of valgus. This was pinned along the epicondylar axis. At this point, the distal femoral cut was accomplished without difficulty. We then sized for a #8L femoral component and pinned the chamfer guide in 3 degrees of external rotation. The anterior, posterior, and chamfer cuts were accomplished without difficulty followed by the Attune RP box cutting guide and the box cut. We also removed posterior osteophytes from the posterior femoral condyles. The posterior capsule was injected with Exparel solution. The knee was brought into full extension. We checked our extension gap and fit a 6 mm trial lollipop. Distracting in extension with a lamina spreader,  bleeders in the posterior capsule, Posterior medial and posterior lateral gutter were cauterized.  The transexamic acid-soaked sponge was then placed in the gap of the knee in extension. The knee was flexed 30. The posterior patella cut was accomplished with the 9.5 mm Attune cutting guide, sized for a 41 mm dome, and the fixation pegs drilled.The knee was then once again hyperflexed exposing the proximal tibia. We sized for a # 10 tibial base plate, applied the smokestack and the conical reamer followed by the the Delta fin keel punch. We then hammered into place the Attune RP trial femoral component, drilled the lugs, inserted a  6 mm trial bearing, trial patellar button, and took the knee through range of motion from 0-130 degrees. Medial and lateral ligamentous stability was checked. No thumb pressure was required for patellar Tracking.  All trial components were removed, mating surfaces irrigated with pulse lavage, and dried with suction and sponges. 10 cc of the Exparel solution was applied to the cancellus bone of the patella distal femur and proximal tibia.  After waiting 30 seconds, the bony surfaces were again, dried with sponges. A double batch of DePuy HV cement was mixed and applied to all bony metallic mating surfaces  except for the posterior condyles of the femur itself. In order, we hammered into place the tibial tray and removed excess cement, the femoral component and removed excess cement. The final Attune RP bearing was inserted, and the knee brought to full extension with compression. The patellar button was clamped into place, and excess cement removed. The knee was held at 30 flexion with compression using the second surefoot, while the cement cured. The wound was irrigated out with normal saline solution pulse lavage. The rest of the Exparel was injected into the parapatellar arthrotomy, subcutaneous tissues, and periosteal tissues. The parapatellar arthrotomy was closed with running #1 Vicryl suture. The subcutaneous tissue with 3-0 undyed Vicryl suture, and the skin with running 3-0 SQ vicryl. An Aquacil dressing and Ace wrap were applied.  The patient was taken to recovery room without difficulty.   Nestor Lewandowsky 08/18/2023, 12:09 PM

## 2023-08-18 NOTE — Discharge Instructions (Addendum)

## 2023-08-18 NOTE — Anesthesia Procedure Notes (Signed)
Procedure Name: MAC Date/Time: 08/18/2023 1:27 PM  Performed by: Cleda Clarks, CRNAPre-anesthesia Checklist: Patient identified, Emergency Drugs available, Suction available, Patient being monitored and Timeout performed Patient Re-evaluated:Patient Re-evaluated prior to induction Oxygen Delivery Method: Simple face mask Preoxygenation: Pre-oxygenation with 100% oxygen Placement Confirmation: positive ETCO2

## 2023-08-18 NOTE — Anesthesia Procedure Notes (Addendum)
Spinal  Start time: 08/18/2023 1:34 PM End time: 08/18/2023 1:36 PM Reason for block: surgical anesthesia Staffing Performed: anesthesiologist  Anesthesiologist: Shelton Silvas, MD Performed by: Shelton Silvas, MD Authorized by: Shelton Silvas, MD   Preanesthetic Checklist Completed: patient identified, IV checked, site marked, risks and benefits discussed, surgical consent, monitors and equipment checked, pre-op evaluation and timeout performed Spinal Block Patient position: sitting Prep: DuraPrep and site prepped and draped Location: L3-4 Injection technique: single-shot Needle Needle type: Pencan  Needle gauge: 24 G Needle length: 10 cm Needle insertion depth: 10 cm Additional Notes Patient tolerated well. No immediate complications.  Functioning IV was confirmed and monitors were applied. Sterile prep and drape, including hand hygiene and sterile gloves were used. The patient was positioned and the back was prepped. The skin was anesthetized with lidocaine. Free flow of clear CSF was obtained prior to injecting local anesthetic into the CSF. The spinal needle aspirated freely following injection. The needle was carefully withdrawn. The patient tolerated the procedure well.

## 2023-08-18 NOTE — Transfer of Care (Signed)
Immediate Anesthesia Transfer of Care Note  Patient: Nathan Carson  Procedure(s) Performed: LEFT TOTAL KNEE ARTHROPLASTY (Left: Knee)  Patient Location: PACU  Anesthesia Type:MAC and Spinal  Level of Consciousness: awake, alert , and oriented  Airway & Oxygen Therapy: Patient Spontanous Breathing  Post-op Assessment: Report given to RN and Post -op Vital signs reviewed and stable  Post vital signs: Reviewed and stable  Last Vitals:  Vitals Value Taken Time  BP 120/83 08/18/23 1605  Temp    Pulse 70 08/18/23 1607  Resp 14 08/18/23 1607  SpO2 93 % 08/18/23 1607  Vitals shown include unfiled device data.  Last Pain:  Vitals:   08/18/23 1210  TempSrc:   PainSc: 0-No pain         Complications: No notable events documented.

## 2023-08-18 NOTE — Interval H&P Note (Signed)
History and Physical Interval Note:  08/18/2023 12:07 PM  Nathan Carson  has presented today for surgery, with the diagnosis of LEFT KNEE OSTEOARTHRITIS.  The various methods of treatment have been discussed with the patient and family. After consideration of risks, benefits and other options for treatment, the patient has consented to  Procedure(s): LEFT TOTAL KNEE ARTHROPLASTY (Left) as a surgical intervention.  The patient's history has been reviewed, patient examined, no change in status, stable for surgery.  I have reviewed the patient's chart and labs.  Questions were answered to the patient's satisfaction.     Nestor Lewandowsky

## 2023-08-18 NOTE — Anesthesia Preprocedure Evaluation (Addendum)
Anesthesia Evaluation  Patient identified by MRN, date of birth, ID band Patient awake    Reviewed: Allergy & Precautions, NPO status , Patient's Chart, lab work & pertinent test results  Airway Mallampati: III  TM Distance: >3 FB Neck ROM: Full    Dental  (+) Teeth Intact, Dental Advisory Given   Pulmonary sleep apnea    breath sounds clear to auscultation       Cardiovascular hypertension, Pt. on medications  Rhythm:Regular Rate:Normal     Neuro/Psych  Headaches PSYCHIATRIC DISORDERS Anxiety        GI/Hepatic Neg liver ROS,GERD  ,,  Endo/Other  diabetes, Type 2, Oral Hypoglycemic Agents    Renal/GU Renal disease     Musculoskeletal  (+) Arthritis ,    Abdominal   Peds  Hematology negative hematology ROS (+)   Anesthesia Other Findings   Reproductive/Obstetrics                             Anesthesia Physical Anesthesia Plan  ASA: 2  Anesthesia Plan: General   Post-op Pain Management: Tylenol PO (pre-op)*   Induction: Intravenous  PONV Risk Score and Plan: 3 and Ondansetron, Dexamethasone, Midazolam and Propofol infusion  Airway Management Planned: Natural Airway and Nasal Cannula  Additional Equipment: None  Intra-op Plan:   Post-operative Plan:   Informed Consent: I have reviewed the patients History and Physical, chart, labs and discussed the procedure including the risks, benefits and alternatives for the proposed anesthesia with the patient or authorized representative who has indicated his/her understanding and acceptance.       Plan Discussed with: CRNA  Anesthesia Plan Comments: (Lab Results      Component                Value               Date                      WBC                      5.7                 08/08/2023                HGB                      13.7                08/08/2023                HCT                      42.9                08/08/2023                 MCV                      99.3                08/08/2023                PLT                      288  08/08/2023           )       Anesthesia Quick Evaluation

## 2023-08-18 NOTE — Evaluation (Signed)
Physical Therapy Evaluation Patient Details Name: Nathan Carson MRN: [id number] DOB: Feb 22, 1966 Today's Date: 08/18/2023  History of Present Illness  57 yo male presents to therapy s/p L TKA on 08/18/2023 due to failure of conservative measures. Pt PMH includes but is not limited to: CKD, DM II, GERD, HTN, R RTC tear s/p repair  OSA and R TKA on 05/12/2023.  Clinical Impression       Maddex Alltop is a 57 y.o. male  POD 0 s/p L TKA. Patient reports IND with mobility at baseline. Patient is now limited by functional impairments (see PT problem list below) and requires CGA and cues for transfers and gait with RW. Patient was able to ambulate 30 and 20 feet with RW and CGA and cues for safe walker management. Patient educated on safe sequencing for stair mobility with R handrail only, fall risk prevention, pain management and goal, use of Cp/ice and car transfers pt and spouse verbalized understanding of safe guarding position for people assisting with mobility. Patient instructed in exercises to facilitate ROM and circulation reviewed and HO provided. Patient will benefit from continued skilled PT interventions to address impairments and progress towards PLOF. Patient has met mobility goals at adequate level for discharge home with family support and [facility] services; will continue to follow if pt continues acute stay to progress towards Mod I goals.     If plan is discharge home, recommend the following: A little help with walking and/or transfers;A little help with bathing/dressing/bathroom;Assistance with cooking/housework;Assist for transportation;Help with stairs or ramp for entrance   Can travel by private vehicle        Equipment Recommendations None recommended by PT  Recommendations for Other Services       Functional Status Assessment Patient has had a recent decline in their functional status and demonstrates the ability to make significant improvements in function in a reasonable and  predictable amount of time.     Precautions / Restrictions Precautions Precautions: Fall;Knee Restrictions Weight Bearing Restrictions: No      Mobility  Bed Mobility Overal bed mobility: Needs Assistance Bed Mobility: Supine to Sit     Supine to sit: Supervision, HOB elevated, Used rails     General bed mobility comments: min cues    Transfers Overall transfer level: Needs assistance Equipment used: Rolling walker (2 wheels) Transfers: Sit to/from Stand Sit to Stand: Contact guard assist           General transfer comment: min cues    Ambulation/Gait Ambulation/Gait assistance: Contact guard assist Gait Distance (Feet): 30 Feet Assistive device: Rolling walker (2 wheels) Gait Pattern/deviations: Step-to pattern, Antalgic, Trunk flexed Gait velocity: decreased     General Gait Details: min cues, B UE support at RW to offload  LLE in stance phase  Stairs Stairs: Yes Stairs assistance: Contact guard assist Stair Management: One rail Right Number of Stairs: 3 General stair comments: cues for safety and sequencing for ascending with R LE and descending wtih LLE  Wheelchair Mobility     Tilt Bed    Modified Rankin (Stroke Patients Only)       Balance Overall balance assessment: Needs assistance Sitting-balance support: Feet supported Sitting balance-Leahy Scale: Good     Standing balance support: Bilateral upper extremity supported, During functional activity, Reliant on assistive device for balance Standing balance-Leahy Scale: Fair Standing balance comment: static standing no UE support  Pertinent Vitals/Pain Pain Assessment Pain Assessment: 0-10 Pain Score: 0-No pain Pain Location: no pain L LE/kinee    Home Living Family/patient expects to be discharged to:: Private residence Living Arrangements: Spouse/significant other Available Help at Discharge: Family Type of Home: House Home Access: Stairs  to enter Entrance Stairs-Rails: None Entrance Stairs-Number of Steps: 1 Alternate Level Stairs-Number of Steps: flight Home Layout: Two level;Other (Comment) Home Equipment: Rolling Walker (2 wheels);Cane - single point      Prior Function Prior Level of Function : Independent/Modified Independent;Driving             Mobility Comments: IND with all ADLs, self care tasks and IADLs,       Extremity/Trunk Assessment        Lower Extremity Assessment Lower Extremity Assessment: LLE deficits/detail LLE Deficits / Details: ankle DF/PF 5/5; SLR < 10 degree lag LLE Sensation: decreased light touch (buttock and groin)    Cervical / Trunk Assessment Cervical / Trunk Assessment: Normal  Communication   Communication Communication: No apparent difficulties  Cognition Arousal: Alert Behavior During Therapy: WFL for tasks assessed/performed Overall Cognitive Status: Within Functional Limits for tasks assessed                                          General Comments      Exercises Total Joint Exercises Ankle Circles/Pumps: AROM, Both, 15 reps Quad Sets: AROM, Left, 5 reps Gluteal Sets: AROM, Left, 5 reps Short Arc Quad: AROM, Left, 5 reps Heel Slides: AROM, Left, 5 reps Hip ABduction/ADduction: AROM, Left, 5 reps, Supine Straight Leg Raises: AROM, Left, 5 reps Knee Flexion: AROM, Left, 5 reps, Seated   Assessment/Plan    PT Assessment Patient needs continued PT services  PT Problem List Decreased strength;Decreased range of motion;Decreased activity tolerance;Decreased balance;Decreased mobility;Decreased coordination;Pain       PT Treatment Interventions DME instruction;Gait training;Stair training;Functional mobility training;Therapeutic activities;Therapeutic exercise;Balance training;Neuromuscular re-education;Patient/family education;Modalities    PT Goals (Current goals can be found in the Care Plan section)  Acute Rehab PT Goals Patient  Stated Goal: to be able to return to work PT Goal Formulation: With patient Time For Goal Achievement: 09/08/23 Potential to Achieve Goals: Good    Frequency 7X/week     Co-evaluation               AM-PAC PT "6 Clicks" Mobility  Outcome Measure Help needed turning from your back to your side while in a flat bed without using bedrails?: None Help needed moving from lying on your back to sitting on the side of a flat bed without using bedrails?: A Little Help needed moving to and from a bed to a chair (including a wheelchair)?: A Little Help needed standing up from a chair using your arms (e.g., wheelchair or bedside chair)?: A Little Help needed to walk in hospital room?: A Little Help needed climbing 3-5 steps with a railing? : A Little 6 Click Score: 19    End of Session Equipment Utilized During Treatment: Gait belt Activity Tolerance: Patient tolerated treatment well;No increased pain Patient left: in chair;with call bell/phone within reach;with family/visitor present Nurse Communication: Mobility status;Other (comment) (pt readiness for d/c from PT standpoint) PT Visit Diagnosis: Unsteadiness on feet (R26.81);Other abnormalities of gait and mobility (R26.89);Muscle weakness (generalized) (M62.81);Pain;Difficulty in walking, not elsewhere classified (R26.2) Pain - Right/Left: Left (no L LE pain reported at time of  eval) Pain - part of body: Knee;Leg    Time: 4401-0272 PT Time Calculation (min) (ACUTE ONLY): 28 min   Charges:   PT Evaluation $PT Eval Low Complexity: 1 Low PT Treatments $Gait Training: 8-22 mins PT General Charges $$ ACUTE PT VISIT: 1 Visit         Johnny Bridge, PT Acute Rehab   Jacqualyn Posey 08/18/2023, 6:34 PM

## 2023-08-18 NOTE — Anesthesia Postprocedure Evaluation (Signed)
Anesthesia Post Note  Patient: Sabriel Samberg  Procedure(s) Performed: LEFT TOTAL KNEE ARTHROPLASTY (Left: Knee)     Patient location during evaluation: PACU Anesthesia Type: General Level of consciousness: awake and alert Pain management: pain level controlled Vital Signs Assessment: post-procedure vital signs reviewed and stable Respiratory status: spontaneous breathing, nonlabored ventilation and respiratory function stable Cardiovascular status: blood pressure returned to baseline Postop Assessment: no apparent nausea or vomiting, spinal receding, no headache and no backache Anesthetic complications: no   No notable events documented.  Last Vitals:  Vitals:   08/18/23 1645 08/18/23 1700  BP: (!) 142/102 (!) 144/104  Pulse: (!) 54 75  Resp: 15 20  Temp: 36.5 C   SpO2: 100% 93%    Last Pain:  Vitals:   08/18/23 1700  TempSrc:   PainSc: 0-No pain    LLE Motor Response: Purposeful movement;Responds to commands (08/18/23 1700) LLE Sensation: Decreased (08/18/23 1700) RLE Motor Response: Purposeful movement;Responds to commands (08/18/23 1700) RLE Sensation: Decreased (08/18/23 1700) L Sensory Level: S1-Sole of foot, small toes (08/18/23 1700) R Sensory Level: S1-Sole of foot, small toes (08/18/23 1700)  Shanda Howells

## 2023-08-19 ENCOUNTER — Encounter (HOSPITAL_COMMUNITY): Payer: Self-pay | Admitting: Orthopedic Surgery

## 2023-08-19 ENCOUNTER — Other Ambulatory Visit (HOSPITAL_COMMUNITY): Payer: Self-pay
